# Patient Record
Sex: Female | Born: 1965 | Race: White | Hispanic: No | Smoking: Former smoker
Health system: Southern US, Community
[De-identification: ages and names within clinical notes are randomized; demographics above are authoritative.]

## PROBLEM LIST (undated history)

## (undated) DIAGNOSIS — M1990 Unspecified osteoarthritis, unspecified site: Secondary | ICD-10-CM

## (undated) DIAGNOSIS — F419 Anxiety disorder, unspecified: Secondary | ICD-10-CM

## (undated) DIAGNOSIS — F32A Depression, unspecified: Secondary | ICD-10-CM

## (undated) DIAGNOSIS — D500 Iron deficiency anemia secondary to blood loss (chronic): Secondary | ICD-10-CM

## (undated) DIAGNOSIS — F329 Major depressive disorder, single episode, unspecified: Secondary | ICD-10-CM

## (undated) DIAGNOSIS — R200 Anesthesia of skin: Secondary | ICD-10-CM

## (undated) DIAGNOSIS — Z8619 Personal history of other infectious and parasitic diseases: Secondary | ICD-10-CM

## (undated) DIAGNOSIS — I10 Essential (primary) hypertension: Secondary | ICD-10-CM

## (undated) DIAGNOSIS — C801 Malignant (primary) neoplasm, unspecified: Secondary | ICD-10-CM

## (undated) HISTORY — DX: Iron deficiency anemia secondary to blood loss (chronic): D50.0

## (undated) HISTORY — DX: Major depressive disorder, single episode, unspecified: F32.9

## (undated) HISTORY — DX: Depression, unspecified: F32.A

## (undated) HISTORY — DX: Personal history of other infectious and parasitic diseases: Z86.19

## (undated) HISTORY — DX: Anxiety disorder, unspecified: F41.9

---

## 1996-06-29 HISTORY — PX: BREAST SURGERY: SHX581

## 1997-06-29 HISTORY — PX: TUBAL LIGATION: SHX77

## 1998-11-06 ENCOUNTER — Other Ambulatory Visit: Admission: RE | Admit: 1998-11-06 | Discharge: 1998-11-06 | Payer: Self-pay | Admitting: Obstetrics and Gynecology

## 1999-06-30 HISTORY — PX: APPENDECTOMY: SHX54

## 1999-06-30 HISTORY — PX: ABDOMINAL HYSTERECTOMY: SHX81

## 1999-07-30 ENCOUNTER — Encounter: Payer: Self-pay | Admitting: Obstetrics and Gynecology

## 1999-07-30 ENCOUNTER — Ambulatory Visit (HOSPITAL_COMMUNITY): Admission: RE | Admit: 1999-07-30 | Discharge: 1999-07-30 | Payer: Self-pay | Admitting: Obstetrics and Gynecology

## 1999-08-22 ENCOUNTER — Ambulatory Visit (HOSPITAL_COMMUNITY): Admission: RE | Admit: 1999-08-22 | Discharge: 1999-08-22 | Payer: Self-pay | Admitting: Obstetrics and Gynecology

## 1999-08-23 ENCOUNTER — Observation Stay (HOSPITAL_COMMUNITY): Admission: AD | Admit: 1999-08-23 | Discharge: 1999-08-24 | Payer: Self-pay | Admitting: Obstetrics and Gynecology

## 2004-06-29 DIAGNOSIS — F329 Major depressive disorder, single episode, unspecified: Secondary | ICD-10-CM | POA: Insufficient documentation

## 2004-06-29 DIAGNOSIS — F32A Depression, unspecified: Secondary | ICD-10-CM | POA: Insufficient documentation

## 2004-06-29 DIAGNOSIS — F419 Anxiety disorder, unspecified: Secondary | ICD-10-CM | POA: Insufficient documentation

## 2004-06-29 DIAGNOSIS — Z8619 Personal history of other infectious and parasitic diseases: Secondary | ICD-10-CM | POA: Insufficient documentation

## 2006-06-29 HISTORY — PX: BASAL CELL CARCINOMA EXCISION: SHX1214

## 2007-08-22 DIAGNOSIS — I10 Essential (primary) hypertension: Secondary | ICD-10-CM | POA: Insufficient documentation

## 2007-09-18 DIAGNOSIS — Z8371 Family history of colonic polyps: Secondary | ICD-10-CM | POA: Insufficient documentation

## 2009-02-01 DIAGNOSIS — E781 Pure hyperglyceridemia: Secondary | ICD-10-CM | POA: Insufficient documentation

## 2011-06-30 HISTORY — PX: HIP ARTHROSCOPY: SUR88

## 2012-06-29 HISTORY — PX: MOHS SURGERY: SUR867

## 2013-04-06 LAB — BASIC METABOLIC PANEL
BUN: 8 mg/dL (ref 4–21)
CREATININE: 0.6 mg/dL (ref 0.5–1.1)
Glucose: 100 mg/dL
Sodium: 142 mmol/L (ref 137–147)

## 2013-04-06 LAB — LIPID PANEL
CHOLESTEROL: 186 mg/dL (ref 0–200)
HDL: 65 mg/dL (ref 35–70)
LDL Cholesterol: 87 mg/dL
Triglycerides: 171 mg/dL — AB (ref 40–160)

## 2013-07-28 ENCOUNTER — Ambulatory Visit: Payer: Self-pay | Admitting: Family Medicine

## 2013-08-03 ENCOUNTER — Ambulatory Visit: Payer: Self-pay | Admitting: Family Medicine

## 2013-10-16 NOTE — Progress Notes (Signed)
Need orders in EPIC.  Surgery scheduled for 10/31/13.  Thank You.

## 2013-10-17 ENCOUNTER — Encounter (HOSPITAL_COMMUNITY): Payer: Self-pay | Admitting: Pharmacy Technician

## 2013-10-17 NOTE — Progress Notes (Signed)
Surgery on 10/31/2013.  Preop on 4/28 at 1030am.  Need orders in EPIC  Thank You.

## 2013-10-24 ENCOUNTER — Encounter (HOSPITAL_COMMUNITY): Payer: Self-pay

## 2013-10-24 ENCOUNTER — Encounter (HOSPITAL_COMMUNITY)
Admission: RE | Admit: 2013-10-24 | Discharge: 2013-10-24 | Disposition: A | Discharge status: Home / Self Care | Payer: Medicaid Other | Source: Ambulatory Visit | Attending: Orthopedic Surgery | Admitting: Orthopedic Surgery

## 2013-10-24 ENCOUNTER — Encounter (INDEPENDENT_AMBULATORY_CARE_PROVIDER_SITE_OTHER): Payer: Self-pay

## 2013-10-24 ENCOUNTER — Ambulatory Visit (HOSPITAL_COMMUNITY)
Admission: RE | Admit: 2013-10-24 | Discharge: 2013-10-24 | Disposition: A | Discharge status: Home / Self Care | Payer: Medicaid Other | Source: Ambulatory Visit | Attending: Anesthesiology | Admitting: Anesthesiology

## 2013-10-24 DIAGNOSIS — Z01818 Encounter for other preprocedural examination: Secondary | ICD-10-CM | POA: Insufficient documentation

## 2013-10-24 DIAGNOSIS — Z01812 Encounter for preprocedural laboratory examination: Secondary | ICD-10-CM | POA: Insufficient documentation

## 2013-10-24 HISTORY — DX: Unspecified osteoarthritis, unspecified site: M19.90

## 2013-10-24 HISTORY — DX: Essential (primary) hypertension: I10

## 2013-10-24 HISTORY — DX: Anesthesia of skin: R20.0

## 2013-10-24 HISTORY — DX: Malignant (primary) neoplasm, unspecified: C80.1

## 2013-10-24 LAB — URINALYSIS, ROUTINE W REFLEX MICROSCOPIC
Bilirubin Urine: NEGATIVE
GLUCOSE, UA: NEGATIVE mg/dL
Hgb urine dipstick: NEGATIVE
Ketones, ur: NEGATIVE mg/dL
LEUKOCYTES UA: NEGATIVE
Nitrite: NEGATIVE
PH: 6 (ref 5.0–8.0)
Protein, ur: NEGATIVE mg/dL
Specific Gravity, Urine: 1.005 (ref 1.005–1.030)
Urobilinogen, UA: 0.2 mg/dL (ref 0.0–1.0)

## 2013-10-24 LAB — CBC
HEMATOCRIT: 41 % (ref 36.0–46.0)
Hemoglobin: 13.9 g/dL (ref 12.0–15.0)
MCH: 30.5 pg (ref 26.0–34.0)
MCHC: 33.9 g/dL (ref 30.0–36.0)
MCV: 89.9 fL (ref 78.0–100.0)
Platelets: 322 10*3/uL (ref 150–400)
RBC: 4.56 MIL/uL (ref 3.87–5.11)
RDW: 12.7 % (ref 11.5–15.5)
WBC: 7.5 10*3/uL (ref 4.0–10.5)

## 2013-10-24 LAB — SURGICAL PCR SCREEN
MRSA, PCR: INVALID — AB
Staphylococcus aureus: INVALID — AB

## 2013-10-24 LAB — BASIC METABOLIC PANEL
BUN: 11 mg/dL (ref 6–23)
CHLORIDE: 100 meq/L (ref 96–112)
CO2: 26 meq/L (ref 19–32)
CREATININE: 0.58 mg/dL (ref 0.50–1.10)
Calcium: 9.5 mg/dL (ref 8.4–10.5)
GFR calc Af Amer: >90 mL/min (ref >90)
GFR calc non Af Amer: >90 mL/min (ref >90)
Glucose, Bld: 114 mg/dL — ABNORMAL HIGH (ref 70–99)
Potassium: 4.2 mEq/L (ref 3.7–5.3)
Sodium: 138 mEq/L (ref 137–147)

## 2013-10-24 LAB — PROTIME-INR
INR: 0.96 (ref 0.00–1.49)
Prothrombin Time: 12.6 seconds (ref 11.6–15.2)

## 2013-10-24 LAB — APTT: aPTT: 28 seconds (ref 24–37)

## 2013-10-24 LAB — ABO/RH: ABO/RH(D): A POS

## 2013-10-24 NOTE — Pre-Procedure Instructions (Addendum)
EKG REPORT ON PT'S CHART FROM DR. D. FISHER FROM 04/03/13 AND MEDICAL CLEARANCE ON CHART FROM DR. FISHER. CXR WAS DONE TODAY PREOP AS PER ANESTHESIOLOGIST'S GUIDELINES

## 2013-10-24 NOTE — Patient Instructions (Addendum)
YOUR SURGERY IS SCHEDULED AT [facility]  ON:  Tuesday  5/5  REPORT TO  SHORT STAY CENTER AT:   9:00 AM         PHONE # FOR SHORT STAY IS [phone]  DO NOT EAT OR DRINK ANYTHING AFTER MIDNIGHT THE NIGHT BEFORE YOUR SURGERY.  YOU MAY BRUSH YOUR TEETH, RINSE OUT YOUR MOUTH--BUT NO WATER, NO FOOD, NO CHEWING GUM, NO MINTS, NO CANDIES, NO CHEWING TOBACCO.  PLEASE TAKE THE FOLLOWING MEDICATIONS THE AM OF YOUR SURGERY WITH A FEW SIPS OF WATER:  ALPRAZOLAM ( XANAX ), AMLODIPINE ( NORVASC ).  DO NOT BRING VALUABLES, MONEY, CREDIT CARDS.  DO NOT WEAR JEWELRY, MAKE-UP, NAIL POLISH AND NO METAL PINS OR CLIPS IN YOUR HAIR. CONTACT LENS, DENTURES / PARTIALS, GLASSES SHOULD NOT BE WORN TO SURGERY AND IN MOST CASES-HEARING AIDS WILL NEED TO BE REMOVED.  BRING YOUR GLASSES CASE, ANY EQUIPMENT NEEDED FOR YOUR CONTACT LENS. FOR PATIENTS ADMITTED TO THE HOSPITAL--CHECK OUT TIME THE DAY OF DISCHARGE IS 11:00 AM.  ALL INPATIENT ROOMS ARE PRIVATE - WITH BATHROOM, TELEPHONE, TELEVISION AND WIFI INTERNET.                                                    PLEASE READ OVER ANY  FACT SHEETS THAT YOU WERE GIVEN: MRSA INFORMATION, BLOOD TRANSFUSION INFORMATION, INCENTIVE SPIROMETER INFORMATION.  PLEASE BE AWARE THAT YOU MAY NEED ADDITIONAL BLOOD DRAWN DAY OF YOUR SURGERY  _______________________________________________________________________     Incentive Spirometer  An incentive spirometer is a tool that can help keep your lungs clear and active. This tool measures how well you are filling your lungs with each breath. Taking long deep breaths may help reverse or decrease the chance of developing breathing (pulmonary) problems (especially infection) following:  A long period of time when you are unable to move or be active. BEFORE THE PROCEDURE   If the spirometer includes an indicator to show your best effort, your nurse or respiratory therapist will set it to a desired goal.  If possible,  sit up straight or lean slightly forward. Try not to slouch.  Hold the incentive spirometer in an upright position. INSTRUCTIONS FOR USE  1. Sit on the edge of your bed if possible, or sit up as far as you can in bed or on a chair. 2. Hold the incentive spirometer in an upright position. 3. Breathe out normally. 4. Place the mouthpiece in your mouth and seal your lips tightly around it. 5. Breathe in slowly and as deeply as possible, raising the piston or the [place] toward the top of the column. 6. Hold your breath for 3-5 seconds or for as long as possible. Allow the piston or [place] to fall to the bottom of the column. 7. Remove the mouthpiece from your mouth and breathe out normally. 8. Rest for a few seconds and repeat Steps 1 through 7 at least 10 times every 1-2 hours when you are awake. Take your time and take a few normal breaths between deep breaths. 9. The spirometer may include an indicator to show your best effort. Use the indicator as a goal to work toward during each repetition. 10. After each set of 10 deep breaths, practice coughing to be sure your lungs are clear. If you have an incision (the cut made at  the time of surgery), support your incision when coughing by placing a pillow or rolled up towels firmly against it. Once you are able to get out of bed, walk around indoors and cough well. You may stop using the incentive spirometer when instructed by your caregiver.  RISKS AND COMPLICATIONS  Take your time so you do not get dizzy or light-headed.  If you are in pain, you may need to take or ask for pain medication before doing incentive spirometry. It is harder to take a deep breath if you are having pain. AFTER USE  Rest and breathe slowly and easily.  It can be helpful to keep track of a log of your progress. Your caregiver can provide you with a simple table to help with this. If you are using the spirometer at home, follow these instructions: [place] IF:   You  are having difficultly using the spirometer.  You have trouble using the spirometer as often as instructed.  Your pain medication is not giving enough relief while using the spirometer.  You develop fever of 100.5 F (38.1 C) or higher. SEEK IMMEDIATE MEDICAL CARE IF:   You cough up bloody sputum that had not been present before.  You develop fever of 102 F (38.9 C) or greater.  You develop worsening pain at or near the incision site. MAKE SURE YOU:   Understand these instructions.  Will watch your condition.  Will get help right away if you are not doing well or get worse. Document Released: 10/26/2006 Document Revised: 09/07/2011 Document Reviewed: 12/27/2006 Exit[employer] Patient Information 2014 [place].   ________________________________________________________________________ [facility] - Preparing for Surgery Before surgery, you can play an important role.  Because skin is not sterile, your skin needs to be as free of germs as possible.  You can reduce the number of germs on your skin by washing with CHG (chlorahexidine gluconate) soap before surgery.  CHG is an antiseptic cleaner which kills germs and bonds with the skin to continue killing germs even after washing. Please DO NOT use if you have an allergy to CHG or antibacterial soaps.  If your skin becomes reddened/irritated stop using the CHG and inform your nurse when you arrive at Short Stay. Do not shave (including legs and underarms) for at least 48 hours prior to the first CHG shower.  You may shave your face. Please follow these instructions carefully:  1.  Shower with CHG Soap the night before surgery and the  morning of Surgery.  2.  If you choose to wash your hair, wash your hair first as usual with your  normal  shampoo.  3.  After you shampoo, rinse your hair and body thoroughly to remove the  shampoo.                           4.  Use CHG as you would any other liquid soap.  You can apply chg directly   to the skin and wash                       Gently with a scrungie or clean washcloth.  5.  Apply the CHG Soap to your body ONLY FROM THE NECK DOWN.   Do not use on open                           Wound or open sores.  Avoid contact with eyes, ears mouth and genitals (private parts).                        Genitals (private parts) with your normal soap.             6.  Wash thoroughly, paying special attention to the area where your surgery  will be performed.  7.  Thoroughly rinse your body with warm water from the neck down.  8.  DO NOT shower/wash with your normal soap after using and rinsing off  the CHG Soap.                9.  Pat yourself dry with a clean towel.            10.  Wear clean pajamas.            11.  Place clean sheets on your bed the night of your first shower and do not  sleep with pets. Day of Surgery : Do not apply any lotions/deodorants the morning of surgery.  Please wear clean clothes to the hospital/surgery center.  FAILURE TO FOLLOW THESE INSTRUCTIONS MAY RESULT IN THE CANCELLATION OF YOUR SURGERY PATIENT SIGNATURE_________________________________  NURSE SIGNATURE__________________________________  ________________________________________________________________________  WHAT IS A BLOOD TRANSFUSION? Blood Transfusion Information  A transfusion is the replacement of blood or some of its parts. Blood is made up of multiple cells which provide different functions.  Red blood cells carry oxygen and are used for blood loss replacement.  White blood cells fight against infection.  Platelets control bleeding.  Plasma helps clot blood.  Other blood products are available for specialized needs, such as hemophilia or other clotting disorders. BEFORE THE TRANSFUSION  Who gives blood for transfusions?   Healthy volunteers who are fully evaluated to make sure their blood is safe. This is blood bank blood. Transfusion therapy is the safest it has ever been in the  practice of medicine. Before blood is taken from a donor, a complete history is taken to make sure that person has no history of diseases nor engages in risky social behavior (examples are intravenous drug use or sexual activity with multiple partners). The donor's travel history is screened to minimize risk of transmitting infections, such as malaria. The donated blood is tested for signs of infectious diseases, such as HIV and hepatitis. The blood is then tested to be sure it is compatible with you in order to minimize the chance of a transfusion reaction. If you or a relative donates blood, this is often done in anticipation of surgery and is not appropriate for emergency situations. It takes many days to process the donated blood. RISKS AND COMPLICATIONS Although transfusion therapy is very safe and saves many lives, the main dangers of transfusion include:   Getting an infectious disease.  Developing a transfusion reaction. This is an allergic reaction to something in the blood you were given. Every precaution is taken to prevent this. The decision to have a blood transfusion has been considered carefully by your caregiver before blood is given. Blood is not given unless the benefits outweigh the risks. AFTER THE TRANSFUSION  Right after receiving a blood transfusion, you will usually feel much better and more energetic. This is especially true if your red blood cells have gotten low (anemic). The transfusion raises the level of the red blood cells which carry oxygen, and this usually causes an energy increase.  The nurse administering the transfusion will monitor  you carefully for complications. HOME CARE INSTRUCTIONS  No special instructions are needed after a transfusion. You may find your energy is better. Speak with your caregiver about any limitations on activity for underlying diseases you may have. SEEK MEDICAL CARE IF:   Your condition is not improving after your transfusion.  You  develop redness or irritation at the intravenous (IV) site. SEEK IMMEDIATE MEDICAL CARE IF:  Any of the following symptoms occur over the next 12 hours:  Shaking chills.  You have a temperature by mouth above 102 F (38.9 C), not controlled by medicine.  Chest, back, or muscle pain.  People around you feel you are not acting correctly or are confused.  Shortness of breath or difficulty breathing.  Dizziness and fainting.  You get a rash or develop hives.  You have a decrease in urine output.  Your urine turns a dark color or changes to pink, red, or brown. Any of the following symptoms occur over the next 10 days:  You have a temperature by mouth above 102 F (38.9 C), not controlled by medicine.  Shortness of breath.  Weakness after normal activity.  The white part of the eye turns yellow (jaundice).  You have a decrease in the amount of urine or are urinating less often.  Your urine turns a dark color or changes to pink, red, or brown. Document Released: 06/12/2000 Document Revised: 09/07/2011 Document Reviewed: 01/30/2008 [facility] Patient Information 2014 [place], [place].  _______________________________________________________________________

## 2013-10-26 LAB — MRSA CULTURE

## 2013-10-31 ENCOUNTER — Encounter (HOSPITAL_COMMUNITY): Payer: Self-pay | Admitting: *Deleted

## 2013-10-31 ENCOUNTER — Inpatient Hospital Stay (HOSPITAL_COMMUNITY)
Admission: RE | Admit: 2013-10-31 | Discharge: 2013-11-01 | DRG: 470 | Disposition: A | Discharge status: Home Health | Payer: Medicaid Other | Source: Ambulatory Visit | Attending: Orthopedic Surgery | Admitting: Orthopedic Surgery

## 2013-10-31 ENCOUNTER — Inpatient Hospital Stay (HOSPITAL_COMMUNITY): Payer: Medicaid Other | Admitting: Registered Nurse

## 2013-10-31 ENCOUNTER — Encounter (HOSPITAL_COMMUNITY)
Admission: RE | Disposition: A | Discharge status: Home Health | Payer: Self-pay | Source: Ambulatory Visit | Attending: Orthopedic Surgery

## 2013-10-31 ENCOUNTER — Encounter (HOSPITAL_COMMUNITY): Payer: Medicaid Other | Admitting: Registered Nurse

## 2013-10-31 ENCOUNTER — Inpatient Hospital Stay (HOSPITAL_COMMUNITY): Payer: Medicaid Other

## 2013-10-31 DIAGNOSIS — E663 Overweight: Secondary | ICD-10-CM | POA: Diagnosis present

## 2013-10-31 DIAGNOSIS — I10 Essential (primary) hypertension: Secondary | ICD-10-CM | POA: Diagnosis present

## 2013-10-31 DIAGNOSIS — M1610 Unilateral primary osteoarthritis, unspecified hip: Principal | ICD-10-CM | POA: Diagnosis present

## 2013-10-31 DIAGNOSIS — M169 Osteoarthritis of hip, unspecified: Principal | ICD-10-CM | POA: Diagnosis present

## 2013-10-31 DIAGNOSIS — Z87891 Personal history of nicotine dependence: Secondary | ICD-10-CM

## 2013-10-31 DIAGNOSIS — D62 Acute posthemorrhagic anemia: Secondary | ICD-10-CM | POA: Diagnosis not present

## 2013-10-31 DIAGNOSIS — Z96649 Presence of unspecified artificial hip joint: Secondary | ICD-10-CM

## 2013-10-31 DIAGNOSIS — D500 Iron deficiency anemia secondary to blood loss (chronic): Secondary | ICD-10-CM | POA: Diagnosis not present

## 2013-10-31 DIAGNOSIS — Z6827 Body mass index (BMI) 27.0-27.9, adult: Secondary | ICD-10-CM

## 2013-10-31 HISTORY — PX: TOTAL HIP ARTHROPLASTY: SHX124

## 2013-10-31 LAB — TYPE AND SCREEN
ABO/RH(D): A POS
Antibody Screen: NEGATIVE

## 2013-10-31 SURGERY — ARTHROPLASTY, HIP, TOTAL, ANTERIOR APPROACH
Anesthesia: General | Site: Hip | Laterality: Right

## 2013-10-31 MED ORDER — 0.9 % SODIUM CHLORIDE (POUR BTL) OPTIME
TOPICAL | Status: DC | PRN
  Administered 2013-10-31: 1000 mL

## 2013-10-31 MED ORDER — POLYETHYLENE GLYCOL 3350 17 G PO PACK: 17.0000 g | PACK | Freq: Every day | ORAL | Status: DC | PRN

## 2013-10-31 MED ORDER — PROMETHAZINE HCL 25 MG/ML IJ SOLN: 6.2500 mg | INTRAMUSCULAR | Status: DC | PRN

## 2013-10-31 MED ORDER — ALPRAZOLAM 0.5 MG PO TABS
0.5000 mg | ORAL_TABLET | Freq: Two times a day (BID) | ORAL | Status: DC | PRN
  Filled 2013-10-31: qty 1

## 2013-10-31 MED ORDER — DOCUSATE SODIUM 100 MG PO CAPS
100.0000 mg | ORAL_CAPSULE | Freq: Two times a day (BID) | ORAL | Status: DC
  Administered 2013-10-31 – 2013-11-01 (×3): 100 mg via ORAL

## 2013-10-31 MED ORDER — HYDROMORPHONE HCL PF 1 MG/ML IJ SOLN
0.2500 mg | INTRAMUSCULAR | Status: DC | PRN
  Administered 2013-10-31 (×4): 0.5 mg via INTRAVENOUS

## 2013-10-31 MED ORDER — MIDAZOLAM HCL 2 MG/2ML IJ SOLN
INTRAMUSCULAR | Status: AC
  Filled 2013-10-31: qty 2

## 2013-10-31 MED ORDER — LACTATED RINGERS IV SOLN
INTRAVENOUS | Status: DC
  Administered 2013-10-31: 1000 mL via INTRAVENOUS

## 2013-10-31 MED ORDER — PROMETHAZINE HCL 25 MG/ML IJ SOLN
12.5000 mg | Freq: Four times a day (QID) | INTRAMUSCULAR | Status: DC | PRN
  Administered 2013-10-31: 12.5 mg via INTRAVENOUS
  Filled 2013-10-31: qty 1

## 2013-10-31 MED ORDER — PROPOFOL 10 MG/ML IV BOLUS
INTRAVENOUS | Status: DC | PRN
  Administered 2013-10-31: 200 mg via INTRAVENOUS

## 2013-10-31 MED ORDER — FERROUS SULFATE 325 (65 FE) MG PO TABS
325.0000 mg | ORAL_TABLET | Freq: Three times a day (TID) | ORAL | Status: DC
  Filled 2013-10-31 (×5): qty 1

## 2013-10-31 MED ORDER — FENTANYL CITRATE 0.05 MG/ML IJ SOLN
INTRAMUSCULAR | Status: DC | PRN
  Administered 2013-10-31: 50 ug via INTRAVENOUS
  Administered 2013-10-31: 150 ug via INTRAVENOUS
  Administered 2013-10-31: 50 ug via INTRAVENOUS

## 2013-10-31 MED ORDER — ONDANSETRON HCL 4 MG/2ML IJ SOLN
INTRAMUSCULAR | Status: DC | PRN
  Administered 2013-10-31: 4 mg via INTRAVENOUS

## 2013-10-31 MED ORDER — CEFAZOLIN SODIUM 1-5 GM-% IV SOLN
1.0000 g | Freq: Four times a day (QID) | INTRAVENOUS | Status: AC
  Administered 2013-10-31 (×2): 1 g via INTRAVENOUS
  Filled 2013-10-31 (×2): qty 50

## 2013-10-31 MED ORDER — ESTRADIOL 1 MG PO TABS
1.0000 mg | ORAL_TABLET | Freq: Every day | ORAL | Status: DC
  Filled 2013-10-31 (×2): qty 1

## 2013-10-31 MED ORDER — METHOCARBAMOL 500 MG PO TABS
500.0000 mg | ORAL_TABLET | Freq: Four times a day (QID) | ORAL | Status: DC | PRN
  Administered 2013-10-31 – 2013-11-01 (×2): 500 mg via ORAL
  Filled 2013-10-31 (×2): qty 1

## 2013-10-31 MED ORDER — NEOSTIGMINE METHYLSULFATE 10 MG/10ML IV SOLN
INTRAVENOUS | Status: DC | PRN
  Administered 2013-10-31: 4 mg via INTRAVENOUS

## 2013-10-31 MED ORDER — ROCURONIUM BROMIDE 100 MG/10ML IV SOLN
INTRAVENOUS | Status: DC | PRN
  Administered 2013-10-31: 10 mg via INTRAVENOUS
  Administered 2013-10-31: 40 mg via INTRAVENOUS

## 2013-10-31 MED ORDER — LIDOCAINE HCL (CARDIAC) 20 MG/ML IV SOLN
INTRAVENOUS | Status: AC
  Filled 2013-10-31: qty 5

## 2013-10-31 MED ORDER — HYDROMORPHONE HCL PF 1 MG/ML IJ SOLN
INTRAMUSCULAR | Status: DC | PRN
  Administered 2013-10-31: 1 mg via INTRAVENOUS
  Administered 2013-10-31 (×2): 0.5 mg via INTRAVENOUS

## 2013-10-31 MED ORDER — SODIUM CHLORIDE 0.9 % IJ SOLN
INTRAMUSCULAR | Status: AC
  Filled 2013-10-31: qty 10

## 2013-10-31 MED ORDER — CEFAZOLIN SODIUM-DEXTROSE 2-3 GM-% IV SOLR
INTRAVENOUS | Status: AC
  Filled 2013-10-31: qty 50

## 2013-10-31 MED ORDER — HYDROMORPHONE HCL PF 1 MG/ML IJ SOLN
0.5000 mg | INTRAMUSCULAR | Status: DC | PRN
  Administered 2013-10-31: 1 mg via INTRAVENOUS
  Filled 2013-10-31: qty 1

## 2013-10-31 MED ORDER — HYDROMORPHONE HCL PF 1 MG/ML IJ SOLN
INTRAMUSCULAR | Status: AC
  Filled 2013-10-31: qty 1

## 2013-10-31 MED ORDER — METOCLOPRAMIDE HCL 5 MG/ML IJ SOLN
5.0000 mg | Freq: Four times a day (QID) | INTRAMUSCULAR | Status: DC | PRN
  Administered 2013-10-31: 10 mg via INTRAVENOUS
  Filled 2013-10-31: qty 2

## 2013-10-31 MED ORDER — EPHEDRINE SULFATE 50 MG/ML IJ SOLN
INTRAMUSCULAR | Status: AC
  Filled 2013-10-31: qty 1

## 2013-10-31 MED ORDER — MENTHOL 3 MG MT LOZG: 1.0000 | LOZENGE | OROMUCOSAL | Status: DC | PRN

## 2013-10-31 MED ORDER — STERILE WATER FOR IRRIGATION IR SOLN
Status: DC | PRN
  Administered 2013-10-31: 1500 mL

## 2013-10-31 MED ORDER — GLYCOPYRROLATE 0.2 MG/ML IJ SOLN
INTRAMUSCULAR | Status: DC | PRN
  Administered 2013-10-31: .6 mg via INTRAVENOUS

## 2013-10-31 MED ORDER — METHOCARBAMOL 1000 MG/10ML IJ SOLN
500.0000 mg | Freq: Four times a day (QID) | INTRAVENOUS | Status: DC | PRN
  Administered 2013-10-31: 500 mg via INTRAVENOUS
  Filled 2013-10-31: qty 5

## 2013-10-31 MED ORDER — SODIUM CHLORIDE 0.9 % IV SOLN
INTRAVENOUS | Status: DC
  Administered 2013-10-31: 17:00:00 via INTRAVENOUS
  Filled 2013-10-31 (×9): qty 1000

## 2013-10-31 MED ORDER — SENNA 8.6 MG PO TABS
1.0000 | ORAL_TABLET | Freq: Two times a day (BID) | ORAL | Status: DC
  Administered 2013-10-31 – 2013-11-01 (×3): 8.6 mg via ORAL

## 2013-10-31 MED ORDER — DEXAMETHASONE SODIUM PHOSPHATE 10 MG/ML IJ SOLN
INTRAMUSCULAR | Status: DC | PRN
  Administered 2013-10-31: 10 mg via INTRAVENOUS

## 2013-10-31 MED ORDER — PHENOL 1.4 % MT LIQD: 1.0000 | OROMUCOSAL | Status: DC | PRN

## 2013-10-31 MED ORDER — AMLODIPINE BESYLATE 5 MG PO TABS
5.0000 mg | ORAL_TABLET | Freq: Every morning | ORAL | Status: DC
  Administered 2013-11-01: 5 mg via ORAL
  Filled 2013-10-31: qty 1

## 2013-10-31 MED ORDER — DEXAMETHASONE SODIUM PHOSPHATE 10 MG/ML IJ SOLN
10.0000 mg | Freq: Once | INTRAMUSCULAR | Status: DC
  Filled 2013-10-31: qty 1

## 2013-10-31 MED ORDER — ASPIRIN EC 325 MG PO TBEC
325.0000 mg | DELAYED_RELEASE_TABLET | Freq: Two times a day (BID) | ORAL | Status: DC
  Administered 2013-11-01: 325 mg via ORAL
  Filled 2013-10-31 (×3): qty 1

## 2013-10-31 MED ORDER — LIDOCAINE HCL (CARDIAC) 20 MG/ML IV SOLN
INTRAVENOUS | Status: DC | PRN
  Administered 2013-10-31: 100 mg via INTRAVENOUS

## 2013-10-31 MED ORDER — CHLORHEXIDINE GLUCONATE 4 % EX LIQD: 60.0000 mL | Freq: Once | CUTANEOUS | Status: DC

## 2013-10-31 MED ORDER — ONDANSETRON HCL 4 MG/2ML IJ SOLN: 4.0000 mg | Freq: Four times a day (QID) | INTRAMUSCULAR | Status: DC | PRN

## 2013-10-31 MED ORDER — HYDROCODONE-ACETAMINOPHEN 7.5-325 MG PO TABS
1.0000 | ORAL_TABLET | ORAL | Status: DC
  Administered 2013-10-31 – 2013-11-01 (×6): 2 via ORAL
  Filled 2013-10-31 (×6): qty 2

## 2013-10-31 MED ORDER — FENTANYL CITRATE 0.05 MG/ML IJ SOLN
INTRAMUSCULAR | Status: AC
  Filled 2013-10-31: qty 5

## 2013-10-31 MED ORDER — DEXAMETHASONE SODIUM PHOSPHATE 10 MG/ML IJ SOLN
INTRAMUSCULAR | Status: AC
  Filled 2013-10-31: qty 1

## 2013-10-31 MED ORDER — HYDROMORPHONE HCL PF 2 MG/ML IJ SOLN
INTRAMUSCULAR | Status: AC
  Filled 2013-10-31: qty 1

## 2013-10-31 MED ORDER — ONDANSETRON HCL 4 MG/2ML IJ SOLN
INTRAMUSCULAR | Status: AC
  Filled 2013-10-31: qty 2

## 2013-10-31 MED ORDER — ALUM & MAG HYDROXIDE-SIMETH 200-200-20 MG/5ML PO SUSP: 30.0000 mL | ORAL | Status: DC | PRN

## 2013-10-31 MED ORDER — ATROPINE SULFATE 0.4 MG/ML IJ SOLN
INTRAMUSCULAR | Status: AC
  Filled 2013-10-31: qty 1

## 2013-10-31 MED ORDER — CEFAZOLIN SODIUM-DEXTROSE 2-3 GM-% IV SOLR
2.0000 g | INTRAVENOUS | Status: AC
  Administered 2013-10-31: 2 g via INTRAVENOUS

## 2013-10-31 MED ORDER — PROPOFOL 10 MG/ML IV BOLUS
INTRAVENOUS | Status: AC
  Filled 2013-10-31: qty 20

## 2013-10-31 MED ORDER — GLYCOPYRROLATE 0.2 MG/ML IJ SOLN
INTRAMUSCULAR | Status: AC
  Filled 2013-10-31: qty 3

## 2013-10-31 MED ORDER — DIPHENHYDRAMINE HCL 12.5 MG/5ML PO ELIX: 25.0000 mg | ORAL_SOLUTION | Freq: Four times a day (QID) | ORAL | Status: DC | PRN

## 2013-10-31 MED ORDER — EPHEDRINE SULFATE 50 MG/ML IJ SOLN
INTRAMUSCULAR | Status: DC | PRN
  Administered 2013-10-31: 5 mg via INTRAVENOUS

## 2013-10-31 MED ORDER — MIDAZOLAM HCL 5 MG/5ML IJ SOLN
INTRAMUSCULAR | Status: DC | PRN
  Administered 2013-10-31: 2 mg via INTRAVENOUS

## 2013-10-31 MED ORDER — ONDANSETRON HCL 4 MG PO TABS: 4.0000 mg | ORAL_TABLET | Freq: Four times a day (QID) | ORAL | Status: DC | PRN

## 2013-10-31 SURGICAL SUPPLY — 38 items
ADH SKN CLS APL DERMABOND .7 (GAUZE/BANDAGES/DRESSINGS) ×1
BAG SPEC THK2 15X12 ZIP CLS (MISCELLANEOUS)
BAG ZIPLOCK 12X15 (MISCELLANEOUS) IMPLANT
CAPT HIP CERAMAX ×1 IMPLANT
COVER PERINEAL POST (MISCELLANEOUS) ×2 IMPLANT
DERMABOND ADVANCED (GAUZE/BANDAGES/DRESSINGS) ×1
DERMABOND ADVANCED .7 DNX12 (GAUZE/BANDAGES/DRESSINGS) ×1 IMPLANT
DRAPE C-ARM 42X120 X-RAY (DRAPES) ×2 IMPLANT
DRAPE STERI IOBAN 125X83 (DRAPES) ×2 IMPLANT
DRAPE U-SHAPE 47X51 STRL (DRAPES) ×6 IMPLANT
DRSG AQUACEL AG ADV 3.5X10 (GAUZE/BANDAGES/DRESSINGS) ×2 IMPLANT
DURAPREP 26ML APPLICATOR (WOUND CARE) ×2 IMPLANT
ELECT BLADE TIP CTD 4 INCH (ELECTRODE) ×2 IMPLANT
ELECT PENCIL ROCKER SW 15FT (MISCELLANEOUS) ×2 IMPLANT
ELECT REM PT RETURN 15FT ADLT (MISCELLANEOUS) ×2 IMPLANT
FACESHIELD WRAPAROUND (MASK) ×8 IMPLANT
FACESHIELD WRAPAROUND OR TEAM (MASK) ×4 IMPLANT
GLOVE BIOGEL PI IND STRL 7.5 (GLOVE) ×1 IMPLANT
GLOVE BIOGEL PI IND STRL 8 (GLOVE) ×1 IMPLANT
GLOVE BIOGEL PI INDICATOR 7.5 (GLOVE) ×1
GLOVE BIOGEL PI INDICATOR 8 (GLOVE) ×1
GLOVE ECLIPSE 8.0 STRL XLNG CF (GLOVE) ×2 IMPLANT
GLOVE ORTHO TXT STRL SZ7.5 (GLOVE) ×4 IMPLANT
GOWN SPEC L3 XXLG W/TWL (GOWN DISPOSABLE) ×2 IMPLANT
GOWN STRL REUS W/TWL LRG LVL3 (GOWN DISPOSABLE) ×2 IMPLANT
KIT BASIN OR (CUSTOM PROCEDURE TRAY) ×2 IMPLANT
LINER BOOT UNIVERSAL DISP (MISCELLANEOUS) ×2 IMPLANT
PACK TOTAL JOINT (CUSTOM PROCEDURE TRAY) ×2 IMPLANT
PADDING CAST COTTON 6X4 STRL (CAST SUPPLIES) ×2 IMPLANT
SAW OSC TIP CART 19.5X105X1.3 (SAW) ×2 IMPLANT
SUT MNCRL AB 4-0 PS2 18 (SUTURE) ×2 IMPLANT
SUT VIC AB 1 CT1 36 (SUTURE) ×6 IMPLANT
SUT VIC AB 2-0 CT1 27 (SUTURE) ×4
SUT VIC AB 2-0 CT1 TAPERPNT 27 (SUTURE) ×2 IMPLANT
SUT VLOC 180 0 24IN GS25 (SUTURE) ×2 IMPLANT
TOWEL OR 17X26 10 PK STRL BLUE (TOWEL DISPOSABLE) ×2 IMPLANT
TOWEL OR NON WOVEN STRL DISP B (DISPOSABLE) IMPLANT
TRAY FOLEY CATH 14FRSI W/METER (CATHETERS) ×1 IMPLANT

## 2013-10-31 NOTE — Transfer of Care (Signed)
Immediate Anesthesia Transfer of Care Note  Patient: Olivia Meyer  Procedure(s) Performed: Procedure(s): RIGHT TOTAL HIP ARTHROPLASTY ANTERIOR APPROACH (Right)  Patient Location: PACU  Anesthesia Type:General  Level of Consciousness: awake, alert  and oriented  Airway & Oxygen Therapy: Patient Spontanous Breathing and Patient connected to face mask oxygen  Post-op Assessment: Report given to PACU RN and Post -op Vital signs reviewed and stable  Post vital signs: Reviewed and stable  Complications: No apparent anesthesia complications

## 2013-10-31 NOTE — Interval H&P Note (Signed)
History and Physical Interval Note:  10/31/2013 11:44 AM  Olivia Meyer  has presented today for surgery, with the diagnosis of RIGHT HIP OA  The various methods of treatment have been discussed with the patient and family. After consideration of risks, benefits and other options for treatment, the patient has consented to  Procedure(s): RIGHT TOTAL HIP ARTHROPLASTY ANTERIOR APPROACH (Right) as a surgical intervention .  The patient's history has been reviewed, patient examined, no change in status, stable for surgery.  I have reviewed the patient's chart and labs.  Questions were answered to the patient's satisfaction.     Mauri Pole

## 2013-10-31 NOTE — Op Note (Signed)
NAME:  Olivia Meyer                ACCOUNT NO.: [id number]      MEDICAL RECORD NO.: [id number]      FACILITY:  [facility]      PHYSICIAN:  Mauri Pole  DATE OF BIRTH:  1966/06/15     DATE OF PROCEDURE:  10/31/2013                                 OPERATIVE REPORT         PREOPERATIVE DIAGNOSIS: Right  hip osteoarthritis.      POSTOPERATIVE DIAGNOSIS:  Right hip osteoarthritis.      PROCEDURE:  Right total hip replacement through an anterior approach   utilizing DePuy THR system, component size 62mm pinnacle cup, a size 36 neutral   Delta ceramic liner, a size 2 Standard Tri Lock stem with a 36+1.5 delta ceramic   Lumm.      SURGEON:  Pietro Cassis. Alvan Dame, M.D.      ASSISTANT:  Molli Barrows, PA-C      ANESTHESIA:  General.      SPECIMENS:  None.      COMPLICATIONS:  None.      BLOOD LOSS:  325 cc     DRAINS:  None.      INDICATION OF THE PROCEDURE:  Olivia Meyer is a 48 y.o. female who had   presented to office for evaluation of right hip pain.  She has a history of having a right hip arthroscopy 2 years ago with progressive pain and dysfunction.  Radiographs revealed progressive degenerative changes with bone-on-bone   articulation to the  hip joint.  The patient had painful limited range of   motion significantly affecting their overall quality of life.  The patient was failing to    respond to conservative measures, and at this point was ready   to proceed with more definitive measures.  The patient has noted progressive   degenerative changes in his hip, progressive problems and dysfunction   with regarding the hip prior to surgery.  Consent was obtained for   benefit of pain relief.  Specific risk of infection, DVT, component   failure, dislocation, need for revision surgery, as well discussion of   the anterior versus posterior approach were reviewed.  Consent was   obtained for benefit of anterior pain relief through an anterior   approach.       PROCEDURE IN DETAIL:  The patient was brought to operative theater.   Once adequate anesthesia, preoperative antibiotics, 2gm of Ancef administered.   The patient was positioned supine on the OSI Hanna table.  Once adequate   padding of boney process was carried out, we had predraped out the hip, and  used fluoroscopy to confirm orientation of the pelvis and position.      The right hip was then prepped and draped from proximal iliac crest to   mid thigh with shower curtain technique.      Time-out was performed identifying the patient, planned procedure, and   extremity.     An incision was then made 2 cm distal and lateral to the   anterior superior iliac spine extending over the orientation of the   tensor fascia lata muscle and sharp dissection was carried down to the   fascia of the muscle and protractor placed in the  soft tissues.      The fascia was then incised.  The muscle belly was identified and swept   laterally and retractor placed along the superior neck.  Following   cauterization of the circumflex vessels and removing some pericapsular   fat, a second cobra retractor was placed on the inferior neck.  A third   retractor was placed on the anterior acetabulum after elevating the   anterior rectus.  A L-capsulotomy was along the line of the   superior neck to the trochanteric fossa, then extended proximally and   distally.  Tag sutures were placed and the retractors were then placed   intracapsular.  We then identified the trochanteric fossa and   orientation of my neck cut, confirmed this radiographically   and then made a neck osteotomy with the femur on traction.  The femoral   head was removed without difficulty or complication.  Traction was let   off and retractors were placed posterior and anterior around the   acetabulum.      The labrum and foveal tissue were debrided.  I began reaming with a 73mm   reamer and reamed up to 95mm reamer with good bony bed  preparation and a 52   cup was chosen to get a 36 mm ceramic liner.  The final 26mm Pinnacle cup was then impacted under fluoroscopy  to confirm the depth of penetration and orientation with respect to   abduction.  A screw was placed followed by the hole eliminator.  The final   79mm Delta ceramic liner was impacted with good visualized rim fit.  The cup was positioned anatomically within the acetabular portion of the pelvis.      At this point, the femur was rolled at 80 degrees.  Further capsule was   released off the inferior aspect of the femoral neck.  I then   released the superior capsule proximally.  The hook was placed laterally   along the femur and elevated manually and held in position with the bed   hook.  The leg was then extended and adducted with the leg rolled to 100   degrees of external rotation.  Once the proximal femur was fully   exposed, I used a box osteotome to set orientation.  I then began   broaching with the starting chili pepper broach and passed this by hand and then broached up to 2.  With the 2 broach in place I chose a standard neck and did a trial reduction.  The offset was appropriate, leg lengths   appeared to be equal, confirmed radiographically.   Given these findings, I went ahead and dislocated the hip, repositioned all   retractors and positioned the right hip in the extended and abducted position.  The final 2 standard offset Tri Lock stem was   chosen and it was impacted down to the level of neck cut.  Based on this   and the trial reduction, a 36+1.5 delta ceramic Hargan was chosen and   impacted onto a clean and dry trunnion, and the hip was reduced.  The   hip had been irrigated throughout the case again at this point.  I did   reapproximate the superior capsular leaflet to the anterior leaflet   using #1 Vicryl.  The fascia of the   tensor fascia lata muscle was then reapproximated using #1 Vicryl and #0 V-lock sutures.  The   remaining wound was  closed with 2-0 Vicryl and running  4-0 Monocryl.   The hip was cleaned, dried, and dressed sterilely using Dermabond and   Aquacel dressing.  She was then brought   to recovery room in stable condition tolerating the procedure well.    Molli Barrows, PA-C was present for the entirety of the case involved from   preoperative positioning, perioperative retractor management, general   facilitation of the case, as well as primary wound closure as assistant.            Pietro Cassis Alvan Dame, M.D.        10/31/2013 1:26 PM

## 2013-10-31 NOTE — Anesthesia Preprocedure Evaluation (Addendum)
Anesthesia Evaluation  Patient identified by MRN, date of birth, ID band Patient awake    Reviewed: Allergy & Precautions, H&P , NPO status , Patient's Chart, lab work & pertinent test results  Airway Mallampati: II TM Distance: >3 FB Neck ROM: Full    Dental no notable dental hx.    Pulmonary neg pulmonary ROS, former smoker,  breath sounds clear to auscultation  Pulmonary exam normal       Cardiovascular hypertension, Pt. on medications Rhythm:Regular Rate:Normal     Neuro/Psych negative neurological ROS  negative psych ROS   GI/Hepatic negative GI ROS, Neg liver ROS,   Endo/Other  negative endocrine ROS  Renal/GU negative Renal ROS  negative genitourinary   Musculoskeletal negative musculoskeletal ROS (+)   Abdominal   Peds negative pediatric ROS (+)  Hematology negative hematology ROS (+)   Anesthesia Other Findings   Reproductive/Obstetrics negative OB ROS                          Anesthesia Physical Anesthesia Plan  ASA: II  Anesthesia Plan: General   Post-op Pain Management:    Induction: Intravenous  Airway Management Planned: Oral ETT  Additional Equipment:   Intra-op Plan:   Post-operative Plan: Extubation in OR  Informed Consent: I have reviewed the patients History and Physical, chart, labs and discussed the procedure including the risks, benefits and alternatives for the proposed anesthesia with the patient or authorized representative who has indicated his/her understanding and acceptance.   Dental advisory given  Plan Discussed with: CRNA  Anesthesia Plan Comments:         Anesthesia Quick Evaluation

## 2013-10-31 NOTE — H&P (Signed)
CC: OA Right Hip HPI: 48 yo female with Right hip pain for years. Pt. Has had prevous treatment by another physician with hip arthroscopy and intraarticular injection with only short term relief. Pt. Saw another physician and was advised she needed THA. Pt seen in our office and felt to need THA right hip. Surgery, risks, benefits and aftercare discussed with pt. PMH: Allergies: ACE inhibiters and Morphine Meds: Tramadol. Amlodipine. Estroven. Alprazolam. Illness: Hypertension. Anxiety. Cancer (Gynecologic) Surgery: Appendectomy. C section. Partial Hysterectomy. Hip arthroscopy. Mohs surgery Lip. Breast Augmentation. Social Hx: Smoke, Neg. Alcohol, wine 5-7 times a week. Unemployed. Lives alone. Family Hx: Cancer, CVA, CAD, Hypertension, OA. ROS: CNS- + anxiety. Pul- neg. CV- Occaisional leg cramps. GI- Neg. GU- neg. PE: BP 143/93 Pulse 71 Resp 18 HEENT: Normocephalic, PERRL, Neck supple without adenopathy, carotids 2+ without bruit. Chest: Clear to auscultation.  Heart: RRR without mm Abdomen: soft, active BS. No masses or organomegaly. Extremities: Upper WNL. Right hip with decreased ROM with pain. N/V intact with occasional tingling. Previous w/u with doppler(nl) and back x rays. Assesment: OA Right Hip Plan: Total Hip arthroplasty by anterior approach.

## 2013-10-31 NOTE — Anesthesia Postprocedure Evaluation (Signed)
  Anesthesia Post-op Note  Patient: Olivia Meyer  Procedure(s) Performed: Procedure(s) (LRB): RIGHT TOTAL HIP ARTHROPLASTY ANTERIOR APPROACH (Right)  Patient Location: PACU  Anesthesia Type: General  Level of Consciousness: awake and alert   Airway and Oxygen Therapy: Patient Spontanous Breathing  Post-op Pain: mild  Post-op Assessment: Post-op Vital signs reviewed, Patient's Cardiovascular Status Stable, Respiratory Function Stable, Patent Airway and No signs of Nausea or vomiting  Last Vitals:  Filed Vitals:   10/31/13 1458  BP: 130/90  Pulse: 78  Temp: 36.3 C  Resp: 12    Post-op Vital Signs: stable   Complications: No apparent anesthesia complications

## 2013-11-01 DIAGNOSIS — E663 Overweight: Secondary | ICD-10-CM | POA: Diagnosis present

## 2013-11-01 DIAGNOSIS — D500 Iron deficiency anemia secondary to blood loss (chronic): Secondary | ICD-10-CM

## 2013-11-01 HISTORY — DX: Iron deficiency anemia secondary to blood loss (chronic): D50.0

## 2013-11-01 LAB — CBC
HCT: 33.9 % — ABNORMAL LOW (ref 36.0–46.0)
Hemoglobin: 11.4 g/dL — ABNORMAL LOW (ref 12.0–15.0)
MCH: 30.4 pg (ref 26.0–34.0)
MCHC: 33.6 g/dL (ref 30.0–36.0)
MCV: 90.4 fL (ref 78.0–100.0)
Platelets: 308 10*3/uL (ref 150–400)
RBC: 3.75 MIL/uL — ABNORMAL LOW (ref 3.87–5.11)
RDW: 12.7 % (ref 11.5–15.5)
WBC: 11.6 10*3/uL — ABNORMAL HIGH (ref 4.0–10.5)

## 2013-11-01 LAB — BASIC METABOLIC PANEL
BUN: 9 mg/dL (ref 6–23)
CALCIUM: 9 mg/dL (ref 8.4–10.5)
CO2: 24 meq/L (ref 19–32)
Chloride: 103 mEq/L (ref 96–112)
Creatinine, Ser: 0.57 mg/dL (ref 0.50–1.10)
GFR calc Af Amer: >90 mL/min (ref >90)
GFR calc non Af Amer: >90 mL/min (ref >90)
Glucose, Bld: 145 mg/dL — ABNORMAL HIGH (ref 70–99)
Potassium: 4.5 mEq/L (ref 3.7–5.3)
SODIUM: 138 meq/L (ref 137–147)

## 2013-11-01 MED ORDER — METHOCARBAMOL 500 MG PO TABS: 500.0000 mg | ORAL_TABLET | Freq: Four times a day (QID) | ORAL | Status: DC | PRN

## 2013-11-01 MED ORDER — DSS 100 MG PO CAPS: 100.0000 mg | ORAL_CAPSULE | Freq: Two times a day (BID) | ORAL | Status: DC

## 2013-11-01 MED ORDER — ASPIRIN 325 MG PO TBEC: 325.0000 mg | DELAYED_RELEASE_TABLET | Freq: Two times a day (BID) | ORAL | Status: AC

## 2013-11-01 MED ORDER — POLYETHYLENE GLYCOL 3350 17 G PO PACK: 17.0000 g | PACK | Freq: Every day | ORAL | Status: DC | PRN

## 2013-11-01 MED ORDER — FERROUS SULFATE 325 (65 FE) MG PO TABS: 325.0000 mg | ORAL_TABLET | Freq: Three times a day (TID) | ORAL | Status: DC

## 2013-11-01 MED ORDER — HYDROCODONE-ACETAMINOPHEN 7.5-325 MG PO TABS: 1.0000 | ORAL_TABLET | ORAL | Status: DC

## 2013-11-01 NOTE — Progress Notes (Signed)
   Subjective: 1 Day Post-Op Procedure(s) (LRB): RIGHT TOTAL HIP ARTHROPLASTY ANTERIOR APPROACH (Right)   Patient reports pain as mild, pain controlled. No events throughout the night.  Ready to be discharged home.  Objective:   VITALS:   Filed Vitals:   11/01/13  BP: 153/96  Pulse: 73  Temp: 97.9 F (36.6 C)   Resp: 16    Neurovascular intact Dorsiflexion/Plantar flexion intact Incision: dressing C/D/I No cellulitis present Compartment soft  LABS  Recent Labs  11/01/13 0533  HGB 11.4*  HCT 33.9*  WBC 11.6*  PLT 308     Recent Labs  11/01/13 0533  NA 138  K 4.5  BUN 9  CREATININE 0.57  GLUCOSE 145*     Assessment/Plan: 1 Day Post-Op Procedure(s) (LRB): RIGHT TOTAL HIP ARTHROPLASTY ANTERIOR APPROACH (Right) Foley cath d/c'ed Advance diet Up with therapy D/C IV fluids Discharge home with home health Follow up in 2 weeks at [facility]. Follow up with OLIN,Lorell Thibodaux D in 2 weeks.  Contact information:  [facility] [address] [place] [phone]    Expected ABLA  Treated with iron and will observe  Overweight (BMI 25-29.9) Estimated body mass index is 27.64 kg/(m^2) as calculated from the following:   Height as of this encounter: 5\' 3"  (1.6 m).   Weight as of this encounter: 70.761 kg (156 lb). Patient also counseled that weight may inhibit the healing process Patient counseled that losing weight will help with future health issues       West Pugh. Khadeejah Castner   PAC  11/01/2013, 10:18 AM

## 2013-11-01 NOTE — Progress Notes (Signed)
Utilization review completed.  

## 2013-11-01 NOTE — Evaluation (Signed)
Occupational Therapy Evaluation Patient Details Name: Olivia Meyer MRN: [id number] DOB: [birth year] Today's Date: 11/01/2013    History of Present Illness  THA - anterior   Clinical Impression   Pt presents to OT with decreased I with ADL activity s/p THA. All education completed regarding ADL activity and THA    Follow Up Recommendations  No OT follow up          Precautions / Restrictions Precautions Precautions: Fall Restrictions Weight Bearing Restrictions: No      Mobility Bed Mobility Overal bed mobility: Needs Assistance Bed Mobility: Supine to Sit     Supine to sit: Supervision     General bed mobility comments: min cues for use of L LE to self assist  Transfers Overall transfer level: Needs assistance Equipment used: Rolling walker (2 wheeled) Transfers: Sit to/from Stand Sit to Stand: Supervision         General transfer comment: cues for transition position and use of UEs to self assist         ADL Overall ADL's : Needs assistance/impaired Eating/Feeding: Independent       Upper Body Bathing: Set up;Sitting   Lower Body Bathing: Minimal assistance;Sit to/from stand   Upper Body Dressing : Set up;Sitting   Lower Body Dressing: Minimal assistance;Cueing for sequencing   Toilet Transfer: Min guard           Functional mobility during ADLs: Minimal assistance General ADL Comments: Discussed safety with walker for ADL activity , including using a walker bag and not carrying items and walking with the walker     Vision                         Hand Dominance Right   Extremity/Trunk Assessment Upper Extremity Assessment Upper Extremity Assessment: Overall WFL for tasks assessed   Lower Extremity Assessment Lower Extremity Assessment: RLE deficits/detail RLE Deficits / Details: Hip strength 3-/5 with AAROM at hip to 90 flex and 20 abd   Cervical / Trunk Assessment Cervical / Trunk Assessment: Normal   Communication  Communication Communication: No difficulties   Cognition Arousal/Alertness: Awake/alert Behavior During Therapy: WFL for tasks assessed/performed Overall Cognitive Status: Within Functional Limits for tasks assessed                     General Comments       Exercises Exercises: Total Joint     Shoulder Instructions      Home Living Family/patient expects to be discharged to:: Private residence Living Arrangements: Children Available Help at Discharge: Family Type of Home: House Home Access: Stairs to enter Technical brewer of Steps: 1   Home Layout: One level               Home Equipment: None          Prior Functioning/Environment Level of Independence: Independent             OT Diagnosis: Generalized weakness   OT Problem List: Decreased strength;Decreased activity tolerance   OT Treatment/Interventions:      OT Goals(Current goals can be found in the care plan section) Acute Rehab OT Goals Patient Stated Goal: Be able to get back to work  OT Frequency:     Barriers to D/C:            Co-evaluation              End of Session Equipment Utilized During Treatment: Rolling  walker Nurse Communication: Mobility status  Activity Tolerance: Patient tolerated treatment well Patient left: in chair;with family/visitor present;with call bell/phone within reach   Time: 1148-1202 OT Time Calculation (min): 14 min Charges:  OT General Charges $OT Visit: 1 Procedure OT Evaluation $Initial OT Evaluation Tier I: 1 Procedure OT Treatments $Self Care/Home Management : 8-22 mins G-Codes:    Betsy Pries 28-Nov-2013, 12:35 PM

## 2013-11-01 NOTE — Progress Notes (Signed)
Physical Therapy Evaluation Patient Details Name: Olivia Meyer MRN: [id number] DOB: [birth year] Today's Date: 11/01/2013   History of Present Illness     Clinical Impression  Pt s/p R THR presents with decreased R LE strength/ROM and post op pain limiting functional mobility.  Pt should progress well to d/c home with family assist and HHPT follow up.     Follow Up Recommendations Home health PT    Equipment Recommendations  Rolling walker with 5" wheels    Recommendations for Other Services OT consult     Precautions / Restrictions Precautions Precautions: Fall Restrictions Weight Bearing Restrictions: No      Mobility  Bed Mobility Overal bed mobility: Needs Assistance Bed Mobility: Supine to Sit     Supine to sit: Supervision     General bed mobility comments: min cues for use of L LE to self assist  Transfers Overall transfer level: Needs assistance Equipment used: Rolling walker (2 wheeled) Transfers: Sit to/from Stand Sit to Stand: Supervision         General transfer comment: cues for transition position and use of UEs to self assist  Ambulation/Gait Ambulation/Gait assistance: Min guard;Supervision Ambulation Distance (Feet): 150 Feet Assistive device: Rolling walker (2 wheeled) Gait Pattern/deviations: Step-to pattern;Step-through pattern;Decreased step length - right;Decreased step length - left;Shuffle;Trunk flexed     General Gait Details: cues for initial sequence, posture and position from RW  Stairs Stairs: Yes Stairs assistance: Min assist Stair Management: No rails;Step to pattern;Forwards;Backwards Number of Stairs: 1 General stair comments: up/down single step fwd and bkwd  Wheelchair Mobility    Modified Rankin (Stroke Patients Only)       Balance                                             Pertinent Vitals/Pain 5/10; premed, ice pack provided    Home Living Family/patient expects to be discharged  to:: Private residence Living Arrangements: Children Available Help at Discharge: Family Type of Home: House Home Access: Stairs to enter   Technical brewer of Steps: 1 Home Layout: One level Home Equipment: None      Prior Function Level of Independence: Independent               Hand Dominance   Dominant Hand: Right    Extremity/Trunk Assessment   Upper Extremity Assessment: Overall WFL for tasks assessed           Lower Extremity Assessment: RLE deficits/detail RLE Deficits / Details: Hip strength 3-/5 with AAROM at hip to 90 flex and 20 abd    Cervical / Trunk Assessment: Normal  Communication   Communication: No difficulties  Cognition Arousal/Alertness: Awake/alert Behavior During Therapy: WFL for tasks assessed/performed Overall Cognitive Status: Within Functional Limits for tasks assessed                      General Comments      Exercises Total Joint Exercises Ankle Circles/Pumps: AROM;Both;15 reps;Supine Quad Sets: AROM;Both;10 reps;Supine Heel Slides: AAROM;20 reps;Right;Supine Hip ABduction/ADduction: AAROM;Right;10 reps;Supine      Assessment/Plan    PT Assessment Patient needs continued PT services  PT Diagnosis Difficulty walking   PT Problem List Decreased strength;Decreased range of motion;Decreased activity tolerance;Decreased mobility;Decreased knowledge of use of DME;Pain  PT Treatment Interventions DME instruction;Gait training;Stair training;Functional mobility training;Therapeutic activities;Therapeutic exercise;Patient/family education   PT Goals (  Current goals can be found in the Care Plan section) Acute Rehab PT Goals Patient Stated Goal: Be able to get back to work PT Goal Formulation: With patient Time For Goal Achievement: 11/04/13 Potential to Achieve Goals: Good    Frequency 7X/week   Barriers to discharge        Co-evaluation               End of Session Equipment Utilized During  Treatment: Gait belt Activity Tolerance: Patient tolerated treatment well Patient left: in chair;with call bell/phone within reach;with family/visitor present Nurse Communication: Mobility status         Time: 0925-0952 PT Time Calculation (min): 27 min   Charges:   PT Evaluation $Initial PT Evaluation Tier I: 1 Procedure PT Treatments $Gait Training: 8-22 mins $Therapeutic Exercise: 8-22 mins   PT G Codes:          Mathis Fare 11/01/2013, 12:34 PM

## 2013-11-01 NOTE — Care Management Note (Signed)
    Page 1 of 2   11/01/2013     9:10:59 AM CARE MANAGEMENT NOTE 11/01/2013  Patient:  Olivia Meyer, Olivia Meyer   Account Number:  [id number]  Date Initiated:  11/01/2013  Documentation initiated by:  [facility]  Subjective/Objective Assessment:   adm: OA right hip/RIGHT TOTAL HIP ARTHROPLASTY ANTERIOR APPROACH     Action/Plan:   discharge planning   Anticipated DC Date:  11/01/2013   Anticipated DC Plan:  [place]  CM consult      [facility] Choice  HOME HEALTH   Choice offered to / List presented to:  C-1 Patient   DME arranged  3-N-1  Vassie Moselle      DME agency  [place] arranged  [place]   Status of service:  Completed, signed off Medicare Important Message given?   (If response is "NO", the following Medicare IM given date fields will be blank) Date Medicare IM given:   Date Additional Medicare IM given:    Discharge Disposition:  [place]  Per UR Regulation:    If discussed at Long Length of Stay Meetings, dates discussed:    Comments:  11/01/13 09:00 CM met with pt in room to offer choice for [facility] services.  Pt chooses [facility] to render HHPT.  [facility] rep on unit and accepts referral.  [facility] DME delivery rep will deliver 3n1, and rolling walker to room prior to discharge. PA completing F2F.  Address and Contact information verified with pt.  No other CM needs communicated.  Mariane Masters, BSN, CM [phone].

## 2013-11-02 ENCOUNTER — Encounter (HOSPITAL_COMMUNITY): Payer: Self-pay | Admitting: Orthopedic Surgery

## 2013-11-06 NOTE — Discharge Summary (Signed)
Physician Discharge Summary  Patient ID: JAVIER MAMONE MRN: [id number] DOB/AGE: [birth year] 48 y.o.  Admit date: 10/31/2013 Discharge date: 11/01/2013   Procedures:  Procedure(s) (LRB): RIGHT TOTAL HIP ARTHROPLASTY ANTERIOR APPROACH (Right)  Attending Physician:  Dr. Paralee Cancel   Admission Diagnoses:   Right hip OA / pain  Discharge Diagnoses:  Active Problems:   S/P total hip arthroplasty   Expected blood loss anemia   Overweight (BMI 25.0-29.9)  Past Medical History  Diagnosis Date  . Hypertension   . Arthritis     RIGHT HIP OA AND PAIN  . Numbness     RIGHT LEG NUMBNESS SINCE NOV 2014 - HAS HAD ULTRASOUND - NO BLOOD CLOT   . Cancer     BASAL CANCER REMOVED LEFT UPPER LIP    HPI: 48 yo female with Right hip pain for years. Pt. Has had prevous treatment by another physician with hip arthroscopy and intraarticular injection with only short term relief. Pt. Saw another physician and was advised she needed THA. Pt seen in our office and felt to need THA right hip. Surgery, risks, benefits and aftercare discussed with pt.  PCP: Carlyn Reichert, MD   Discharged Condition: good  Hospital Course:  Patient underwent the above stated procedure on 10/31/2013. Patient tolerated the procedure well and brought to the recovery room in good condition and subsequently to the floor.  POD #1 BP: 153/96 ; Pulse: 73 ; Temp: 97.9 F (36.6 C) ; Resp: 16 Patient reports pain as mild, pain controlled. No events throughout the night. Ready to be discharged home. Neurovascular intact, dorsiflexion/plantar flexion intact, incision: dressing C/D/I, no cellulitis present and compartment soft.   LABS  Basename    HGB  11.4  HCT  33.9    Discharge Exam: General appearance: alert, cooperative and no distress Extremities: Homans sign is negative, no sign of DVT, no edema, redness or tenderness in the calves or thighs and no ulcers, gangrene or trophic changes  Disposition:    H ome  with  follow up in 2 weeks   Follow-up Information   Follow up with [facility] (home health physical therapy)    Contact information:   [address] [place] [address] [phone]       Follow up with Mauri Pole, MD. Schedule an appointment as soon as possible for a visit in 2 weeks.   Specialty:  Orthopedic Surgery   Contact information:   [address] [place] [address] [phone]       Discharge Orders   Future Orders Complete By Expires   Call MD / Call 911  As directed    Change dressing  As directed    Constipation Prevention  As directed    Diet - low sodium heart healthy  As directed    Discharge instructions  As directed    Driving restrictions  As directed    Increase activity slowly as tolerated  As directed    TED hose  As directed    Weight bearing as tolerated  As directed    Questions:     Laterality:  Right    Extremity:  Lower        Medication List    STOP taking these medications       estradiol 1 MG tablet  Commonly known as:  ESTRACE      TAKE these medications       ALPRAZolam 0.5 MG tablet  Commonly known  as:  XANAX  Take 0.5 mg by mouth 2 (two) times daily as needed for anxiety.     amLODipine 5 MG tablet  Commonly known as:  NORVASC  Take 5 mg by mouth every morning.     aspirin 325 MG EC tablet  Take 1 tablet (325 mg total) by mouth 2 (two) times daily.     DSS 100 MG Caps  Take 100 mg by mouth 2 (two) times daily.     ferrous sulfate 325 (65 FE) MG tablet  Take 1 tablet (325 mg total) by mouth 3 (three) times daily after meals.     HYDROcodone-acetaminophen 7.5-325 MG per tablet  Commonly known as:  NORCO  Take 1-2 tablets by mouth every 4 (four) hours.     methocarbamol 500 MG tablet  Commonly known as:  ROBAXIN  Take 1 tablet (500 mg total) by mouth every 6 (six) hours as needed for muscle spasms.     polyethylene glycol packet  Commonly known as:  MIRALAX / GLYCOLAX   Take 17 g by mouth daily as needed for mild constipation.         Signed: West Pugh. Devario Bucklew   PAC  11/06/2013, 11:04 AM

## 2014-04-13 ENCOUNTER — Other Ambulatory Visit: Payer: Self-pay

## 2015-01-07 ENCOUNTER — Telehealth: Payer: Self-pay | Admitting: Family Medicine

## 2015-01-07 NOTE — Telephone Encounter (Signed)
Pt stated that she is due for a mammogram but when she called to schedule the appt and explained that she is having tenderness in her breast near the her nipple for the last week, that office suggested that Dr. Caryn Section might want to refer pt for a diagnostic mammogram. I advised that pt would need to come in for OV and pt request that we ask Dr. Caryn Section first because it hasn't been that long since she was seen. Thanks TNP

## 2015-01-07 NOTE — Telephone Encounter (Signed)
Se needs an office visit to check this out so we can determine what type of imaging she needs.

## 2015-01-07 NOTE — Telephone Encounter (Signed)
Patient wants to check her insurance before making an appt, to make sure ov will be covered.

## 2015-01-09 ENCOUNTER — Other Ambulatory Visit: Payer: Self-pay | Admitting: Family Medicine

## 2015-01-09 NOTE — Telephone Encounter (Signed)
Please call in the following medication. [facility] [phone]   Surescripts Out Interface  2 hours ago   (5:42 AM)       Requested Medications     Medication name:  Name from pharmacy:  ALPRAZolam (XANAX) 0.5 MG tablet ALPRAZolam 0.5MG  TAB    Sig: TAKE ONE TABLET BY MOUTH ONCE TO TWICE DAILY AS NEEDED    Dispense: 45 tablet   Refills: 2

## 2015-01-09 NOTE — Telephone Encounter (Signed)
Rx phoned into pharmacy.

## 2015-02-05 ENCOUNTER — Ambulatory Visit: Payer: Medicaid Other | Admitting: Family Medicine

## 2015-02-05 DIAGNOSIS — M545 Low back pain, unspecified: Secondary | ICD-10-CM | POA: Insufficient documentation

## 2015-02-05 DIAGNOSIS — M169 Osteoarthritis of hip, unspecified: Secondary | ICD-10-CM | POA: Insufficient documentation

## 2015-02-05 DIAGNOSIS — Z85828 Personal history of other malignant neoplasm of skin: Secondary | ICD-10-CM | POA: Insufficient documentation

## 2015-02-05 DIAGNOSIS — M79604 Pain in right leg: Secondary | ICD-10-CM | POA: Insufficient documentation

## 2015-02-05 DIAGNOSIS — N951 Menopausal and female climacteric states: Secondary | ICD-10-CM | POA: Insufficient documentation

## 2015-03-31 ENCOUNTER — Other Ambulatory Visit: Payer: Self-pay | Admitting: Family Medicine

## 2015-05-02 ENCOUNTER — Other Ambulatory Visit: Payer: Self-pay | Admitting: *Deleted

## 2015-05-02 MED ORDER — ALPRAZOLAM 0.5 MG PO TABS: ORAL_TABLET | ORAL | Status: DC

## 2015-05-02 NOTE — Telephone Encounter (Signed)
Please call in alprazolam.  

## 2015-05-02 NOTE — Telephone Encounter (Signed)
Rx called in to pharmacy. 

## 2015-05-07 ENCOUNTER — Other Ambulatory Visit: Payer: Self-pay | Admitting: Family Medicine

## 2015-05-09 ENCOUNTER — Other Ambulatory Visit: Payer: Self-pay | Admitting: Family Medicine

## 2015-05-09 NOTE — Telephone Encounter (Signed)
Rx was phoned into pharmacy 05/02/2015. Called rx in again.

## 2015-05-09 NOTE — Telephone Encounter (Addendum)
Pt states [facility] has not rec'd the Rx ALPRAZolam (XANAX) 0.5 MG tablet.  Pt is requesting this sent again.  CB#[phone]/MW

## 2015-08-02 ENCOUNTER — Other Ambulatory Visit: Payer: Self-pay | Admitting: Family Medicine

## 2015-08-07 ENCOUNTER — Other Ambulatory Visit: Payer: Self-pay | Admitting: *Deleted

## 2015-08-07 MED ORDER — AMLODIPINE BESYLATE 5 MG PO TABS: 5.0000 mg | ORAL_TABLET | Freq: Every day | ORAL | Status: DC

## 2015-08-07 NOTE — Telephone Encounter (Signed)
Resent rx for amlodipine to [facility] because they did not receivethe original on sent on 08/03/2015.

## 2015-09-06 ENCOUNTER — Encounter: Payer: Medicaid Other | Admitting: Family Medicine

## 2015-09-16 ENCOUNTER — Other Ambulatory Visit: Payer: Self-pay | Admitting: *Deleted

## 2015-09-16 MED ORDER — ALPRAZOLAM 0.5 MG PO TABS: ORAL_TABLET | ORAL | Status: DC

## 2015-09-16 NOTE — Telephone Encounter (Signed)
Rx called in to pharmacy. 

## 2015-09-16 NOTE — Telephone Encounter (Signed)
Please call in alprazolam.  

## 2015-09-18 ENCOUNTER — Encounter: Payer: Self-pay | Admitting: Family Medicine

## 2015-09-18 ENCOUNTER — Ambulatory Visit (INDEPENDENT_AMBULATORY_CARE_PROVIDER_SITE_OTHER): Payer: Federal, State, Local not specified - PPO | Admitting: Family Medicine

## 2015-09-18 VITALS — BP 128/82 | HR 78 | Temp 97.9°F | Resp 16 | Ht 63.0 in | Wt 167.0 lb

## 2015-09-18 DIAGNOSIS — M7741 Metatarsalgia, right foot: Secondary | ICD-10-CM | POA: Diagnosis not present

## 2015-09-18 DIAGNOSIS — Z1239 Encounter for other screening for malignant neoplasm of breast: Secondary | ICD-10-CM

## 2015-09-18 DIAGNOSIS — Z23 Encounter for immunization: Secondary | ICD-10-CM | POA: Diagnosis not present

## 2015-09-18 DIAGNOSIS — I10 Essential (primary) hypertension: Secondary | ICD-10-CM

## 2015-09-18 DIAGNOSIS — F419 Anxiety disorder, unspecified: Secondary | ICD-10-CM

## 2015-09-18 DIAGNOSIS — Z0000 Encounter for general adult medical examination without abnormal findings: Secondary | ICD-10-CM | POA: Diagnosis not present

## 2015-09-18 DIAGNOSIS — N644 Mastodynia: Secondary | ICD-10-CM

## 2015-09-18 NOTE — Progress Notes (Signed)
Patient: Olivia Meyer, Female    DOB: 1965-11-13, 50 y.o.   MRN: [id number] Visit Date: 09/18/2015  Today's Provider: Lelon Huh, MD   Chief Complaint  Patient presents with  . Annual Exam  . Follow-up    Menopausal symptom  . Hypertension    follow up  . Anxiety    follow up   Subjective:    Annual physical exam Olivia Meyer is a 50 y.o. female who presents today for health maintenance and complete physical. She feels fairly well. She reports exercising 3 times a week. She reports she is sleeping poorly.  -----------------------------------------------------------------  Hypertension, follow-up:  BP Readings from Last 3 Encounters:  09/18/15 128/82  10/20/14 122/84  11/01/13 153/96    She was last seen for hypertension 3 years ago.  BP at that visit was 138/88. Management since that visit includes no changes. She reports good compliance with treatment. She is not having side effects.  She is exercising. She is adherent to low salt diet.   Outside blood pressures are not being checked. She is experiencing lower extremity edema.  Patient denies chest pain, chest pressure/discomfort, claudication, dyspnea, exertional chest pressure/discomfort, fatigue, irregular heart beat, near-syncope, orthopnea, palpitations, paroxysmal nocturnal dyspnea, syncope and tachypnea.   Cardiovascular risk factors include hypertension.  Use of agents associated with hypertension: none.     Weight trend: increasing steadily Wt Readings from Last 3 Encounters:  09/18/15 167 lb (75.751 kg)  10/20/14 157 lb (71.215 kg)  10/31/13 156 lb (70.761 kg)    Current diet: in general, a "healthy" diet    ------------------------------------------------------------------------  Follow up Anxiety: Last office visit was 11 months ago and no changes were made. Today patient come in reporting her Anxiety has worsened. Patient states the Xanax helps improve Anxiety. Has been under more  stress with her daughter. Patient reports he daughter is an IV drug user and has a baby. Both her daughter and grandchild live in her home.  Follow up Menopausal Symptoms: Last office visit was 11 months ago and no changes were made. Patient reports she stopped taking the Estradiol 3-4 months ago due to concerns that it could cause breast cancer. Patient states since stopping the Estradiol her hot flashes have worsened and occur more frequently.   Review of Systems  Constitutional: Positive for unexpected weight change. Negative for fever, chills and fatigue.  HENT: Positive for sinus pressure. Negative for congestion, ear pain, rhinorrhea, sneezing and sore throat.        Has polyp of right sinus  Eyes: Negative.  Negative for pain and redness.  Respiratory: Negative for cough, shortness of breath and wheezing.   Cardiovascular: Positive for leg swelling. Negative for chest pain.  Gastrointestinal: Negative for nausea, abdominal pain, diarrhea, constipation and blood in stool.  Endocrine: Negative for polydipsia and polyphagia.  Genitourinary: Negative.  Negative for dysuria, hematuria, flank pain, vaginal bleeding, vaginal discharge and pelvic pain.       Right breast pain x 3-4 months. Has implants  Musculoskeletal: Positive for back pain (right lower back pain) and joint swelling. Negative for gait problem.       Right foot and toe pain  Skin: Negative for rash.  Allergic/Immunologic: Positive for environmental allergies.  Neurological: Positive for numbness (right thigh) and headaches (pressure on left side). Negative for dizziness, tremors, seizures, weakness and light-headedness.  Hematological: Negative for adenopathy.  Psychiatric/Behavioral: Positive for sleep disturbance and decreased concentration. Negative for behavioral problems, confusion  and dysphoric mood. The patient is nervous/anxious. The patient is not hyperactive.     Social History      She  reports that she quit  smoking about 30 years ago. She has never used smokeless tobacco. She reports that she drinks alcohol. She reports that she does not use illicit drugs.       Social History   Social History  . Marital Status: Married    Spouse Name: N/A  . Number of Children: 3  . Years of Education: N/A   Occupational History  . Homemaker    Social History Main Topics  . Smoking status: Former Smoker    Quit date: 06/29/1985  . Smokeless tobacco: Never Used  . Alcohol Use: 0.0 oz/week    0 Standard drinks or equivalent per week     Comment:   WINE ON WEEK ENDS ( MAYBE 4 GLASSES A WEEK )  . Drug Use: No  . Sexual Activity: Not Asked   Other Topics Concern  . None   Social History Narrative    Past Medical History  Diagnosis Date  . Hypertension   . Arthritis     RIGHT HIP OA AND PAIN  . Numbness     RIGHT LEG NUMBNESS SINCE NOV 2014 - HAS HAD ULTRASOUND - NO BLOOD CLOT   . Cancer (HCC)     BASAL CANCER REMOVED LEFT UPPER LIP  . History of chicken pox   . Depression   . Anxiety      Patient Active Problem List   Diagnosis Date Noted  . Hip osteoarthritis 02/05/2015  . History of basal cell cancer 02/05/2015  . Low back pain radiating to right leg 02/05/2015  . Menopausal symptom 02/05/2015  . Expected blood loss anemia 11/01/2013  . Overweight (BMI 25.0-29.9) 11/01/2013  . S/P total hip arthroplasty 10/31/2013  . History of endogenous hypertriglyceridemia 02/01/2009  . Family history of colonic polyps 09/18/2007  . Essential (primary) hypertension 08/22/2007  . Anxiety 06/29/2004  . Depressive disorder 06/29/2004  . History of herpes genitalis 06/29/2004    Past Surgical History  Procedure Laterality Date  . Mohs surgery  2014    LEFT UPPER LIP  . Cesarean section  1987  1993  1997  . Tubal ligation  1999  . Hip arthroscopy Right 2013  . Breast surgery  1998    BREAST AUGMENTATION  . Total hip arthroplasty Right 10/31/2013    Procedure: RIGHT TOTAL HIP ARTHROPLASTY  ANTERIOR APPROACH;  Surgeon: Mauri Pole, MD;  Location: [facility];  Service: Orthopedics;  Laterality: Right;  . 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 Appendectomy  2001    Family History        Family Status  Relation Status Death Age  . Father Deceased     history of Colorectal Adenoma  . Sister Alive     history of colorectal Adenoma  . Mother Alive         Her family history includes Cervical cancer in her mother; Hypertension in her mother; Irritable bowel syndrome in her mother; Kidney failure in her father; Melanoma in her mother.    Allergies  Allergen Reactions  . Ace Inhibitors     COUGH AND THROAT IRRITATION  . Morphine And Related     ITCHING AND SKIN BLOTCHES  . Prednisone     makers her 'crazy'    Previous Medications   ALPRAZOLAM (XANAX) 0.5 MG TABLET  TAKE ONE TABLET BY MOUTH ONCE TO TWICE DAILY AS NEEDED   AMLODIPINE (NORVASC) 5 MG TABLET    Take 1 tablet (5 mg total) by mouth daily.   B COMPLEX VITAMINS PO    Take 1 tablet by mouth daily.   CHOLECALCIFEROL (VITAMIN D-3) 1000 UNITS CAPS    Take 1 capsule by mouth daily.   DOCUSATE SODIUM 100 MG CAPS    Take 100 mg by mouth 2 (two) times daily.   ESTRADIOL (ESTRACE) 1 MG TABLET    Take 1 tablet by mouth daily. Reported on 09/18/2015   FLAXSEED, LINSEED, (FLAXSEED OIL) 1000 MG CAPS    Take 1 capsule by mouth daily.   FLUTICASONE (FLONASE) 50 MCG/ACT NASAL SPRAY    Place 2 sprays into both nostrils daily.   HYDROCHLOROTHIAZIDE (HYDRODIURIL) 25 MG TABLET    TAKE ONE TABLET BY MOUTH ONCE DAILY **NEED  TO  SCHEDULE  OFFICE  VISIT**   MULTIPLE VITAMINS-MINERALS (MULTIVITAMIN ADULT PO)    Take 1 tablet by mouth daily.   OMEGA-3 FATTY ACIDS (FISH OIL) 1000 MG CAPS    Take 1 capsule by mouth daily.   VITAMIN E 1000 UNIT CAPSULE    Take 1 capsule by mouth daily.    Patient Care Team: [name], MD as PCP - General (Family Medicine) Elgie Collard, MD as Referring Physician  (Gynecology) Brayton Mars, MD as Consulting Physician (Obstetrics and Gynecology)     Objective:   Vitals: BP 128/82 mmHg  Pulse 78  Temp(Src) 97.9 F (36.6 C) (Oral)  Resp 16  Ht 5\' 3"  (1.6 m)  Wt 167 lb (75.751 kg)  BMI 29.59 kg/m2  SpO2 98%  LMP    Physical Exam  General Appearance:    Alert, cooperative, no distress, appears stated age, overweight  Head:    Normocephalic, without obvious abnormality, atraumatic  Eyes:    PERRL, conjunctiva/corneas clear, EOM's intact, fundi    benign, both eyes  Ears:    Normal TM's and external ear canals, both ears  Nose:   Nares swollen, septum midline, mucosa normal, no drainage    or sinus tenderness  Throat:   Lips, mucosa, and tongue normal; teeth and gums normal  Neck:   Supple, symmetrical, trachea midline, no adenopathy;    thyroid:  no enlargement/tenderness/nodules; no carotid   bruit or JVD  Back:     Symmetric, no curvature, ROM normal, no CVA tenderness  Lungs:     Clear to auscultation bilaterally, respirations unlabored  Chest Wall:    No tenderness or deformity   Heart:    Regular rate and rhythm, S1 and S2 normal, no murmur, rub   or gallop  Breast Exam:    No masses, mild tenderness inferior to right areola.   Abdomen:     Soft, non-tender, bowel sounds active all four quadrants,    no masses, no organomegaly  Pelvic:    deferred  Extremities:   Extremities normal, atraumatic, no cyanosis or edema  Pulses:   2+ and symmetric all extremities  Skin:   Skin color, texture, turgor normal, no rashes or lesions  Lymph nodes:   Cervical, supraclavicular, and axillary nodes normal  Neurologic:   CNII-XII intact, normal strength, sensation and reflexes    throughout    Depression Screen PHQ 2/9 Scores 09/18/2015  PHQ - 2 Score 0  PHQ- 9 Score 3   Current Exercise Habits: Home exercise routine, Type of exercise: walking, Time (Minutes): 45, Frequency (  Times/Week): 3, Weekly Exercise (Minutes/Week): 135,  Intensity: Moderate Exercise limited by: None identified    Assessment & Plan:     Routine Health Maintenance and Physical Exam  Exercise Activities and Dietary recommendations Goals    None      Immunization History  Administered Date(s) Administered  . Td 06/30/1999    Health Maintenance  Topic Date Due  . HIV Screening  05/23/1981  . PAP SMEAR  05/24/1987  . TETANUS/TDAP  06/29/2009  . INFLUENZA VACCINE  01/28/2015      Discussed health benefits of physical activity, and encouraged her to engage in regular exercise appropriate for her age and condition.    --------------------------------------------------------------------  1. Annual physical exam   2. Essential hypertension Well controlled.  Continue current medications.   - Lipid Panel With LDL/HDL Ratio - EKG 12-Lead - Comprehensive Metabolic Panel (CMET) - TSH  3. Metatarsalgia, right  - Ambulatory referral to Podiatry  4. Mastalgia Diagnostic mammorgram  5. Need for diphtheria-tetanus-pertussis (Tdap) vaccine  - Tdap vaccine greater than or equal to 7yo IM  6. Screening for breast cancer  - MM Digital Diagnostic Bilat; Future  7. Anxiety Stable on current regiment of alprazolam.

## 2015-09-19 LAB — COMPREHENSIVE METABOLIC PANEL
ALT: 56 IU/L — ABNORMAL HIGH (ref 0–32)
AST: 32 IU/L (ref 0–40)
Albumin/Globulin Ratio: 1.9 (ref 1.2–2.2)
Albumin: 5 g/dL (ref 3.5–5.5)
Alkaline Phosphatase: 83 IU/L (ref 39–117)
BUN/Creatinine Ratio: 20 (ref 9–23)
BUN: 13 mg/dL (ref 6–24)
Bilirubin Total: 0.4 mg/dL (ref 0.0–1.2)
CO2: 27 mmol/L (ref 18–29)
Calcium: 10.6 mg/dL — ABNORMAL HIGH (ref 8.7–10.2)
Chloride: 95 mmol/L — ABNORMAL LOW (ref 96–106)
Creatinine, Ser: 0.64 mg/dL (ref 0.57–1.00)
GFR calc Af Amer: 121 mL/min/{1.73_m2} (ref >59)
GFR calc non Af Amer: 105 mL/min/{1.73_m2} (ref >59)
Globulin, Total: 2.7 g/dL (ref 1.5–4.5)
Glucose: 101 mg/dL — ABNORMAL HIGH (ref 65–99)
Potassium: 4.8 mmol/L (ref 3.5–5.2)
Sodium: 140 mmol/L (ref 134–144)
Total Protein: 7.7 g/dL (ref 6.0–8.5)

## 2015-09-19 LAB — LIPID PANEL WITH LDL/HDL RATIO
CHOLESTEROL TOTAL: 227 mg/dL — AB (ref 100–199)
HDL: 58 mg/dL (ref >39)
LDL Calculated: 106 mg/dL — ABNORMAL HIGH (ref 0–99)
LDl/HDL Ratio: 1.8 ratio units (ref 0.0–3.2)
Triglycerides: 314 mg/dL — ABNORMAL HIGH (ref 0–149)
VLDL CHOLESTEROL CAL: 63 mg/dL — AB (ref 5–40)

## 2015-09-19 LAB — TSH: TSH: 2.15 u[IU]/mL (ref 0.450–4.500)

## 2015-09-23 ENCOUNTER — Telehealth: Payer: Self-pay | Admitting: Family Medicine

## 2015-09-23 NOTE — Telephone Encounter (Signed)
Patient was notified of results. Patient expressed understanding. 

## 2015-09-23 NOTE — Telephone Encounter (Signed)
Pt would like a call back to get the results of the labs that were done on 09/18/15. Thanks TNP

## 2015-10-11 DIAGNOSIS — N644 Mastodynia: Secondary | ICD-10-CM | POA: Diagnosis not present

## 2015-10-11 DIAGNOSIS — N63 Unspecified lump in breast: Secondary | ICD-10-CM | POA: Diagnosis not present

## 2015-10-11 DIAGNOSIS — R922 Inconclusive mammogram: Secondary | ICD-10-CM | POA: Diagnosis not present

## 2015-10-24 ENCOUNTER — Ambulatory Visit: Payer: Federal, State, Local not specified - PPO | Admitting: Podiatry

## 2015-11-28 ENCOUNTER — Encounter: Payer: Self-pay | Admitting: Podiatry

## 2015-11-28 ENCOUNTER — Ambulatory Visit (INDEPENDENT_AMBULATORY_CARE_PROVIDER_SITE_OTHER): Payer: Federal, State, Local not specified - PPO

## 2015-11-28 ENCOUNTER — Ambulatory Visit (INDEPENDENT_AMBULATORY_CARE_PROVIDER_SITE_OTHER): Payer: Federal, State, Local not specified - PPO | Admitting: Podiatry

## 2015-11-28 VITALS — BP 163/97 | HR 78 | Resp 18

## 2015-11-28 DIAGNOSIS — R52 Pain, unspecified: Secondary | ICD-10-CM

## 2015-11-28 DIAGNOSIS — M2010 Hallux valgus (acquired), unspecified foot: Secondary | ICD-10-CM

## 2015-11-28 DIAGNOSIS — M779 Enthesopathy, unspecified: Secondary | ICD-10-CM

## 2015-11-28 DIAGNOSIS — M2040 Other hammer toe(s) (acquired), unspecified foot: Secondary | ICD-10-CM

## 2015-11-28 DIAGNOSIS — M722 Plantar fascial fibromatosis: Secondary | ICD-10-CM | POA: Diagnosis not present

## 2015-11-28 MED ORDER — MELOXICAM 15 MG PO TABS: 15.0000 mg | ORAL_TABLET | Freq: Every day | ORAL | Status: DC

## 2015-11-28 NOTE — Progress Notes (Signed)
   Subjective:    Patient ID: Olivia Meyer, female    DOB: 03/14/1966, 50 y.o.   MRN: [id number]  HPI  50 year old female presents the office today for concerns of a bunion her second toe crossing over on her right foot and pain to the second interspace of the second MPJ as well to his been ongoing for quite some time. She states the toes cause discomfort particular shoe gear. She also gets occasional pain in the bottom of her heel which is worse in the morning. She said no recent treatment. No recent injury. No other complaints.  Review of Systems  All other systems reviewed and are negative.      Objective:   Physical Exam General: AAO x3, NAD  Dermatological: Skin is warm, dry and supple bilateral. Nails x 10 are well manicured; remaining integument appears unremarkable at this time. There are no open sores, no preulcerative lesions, no rash or signs of infection present.  Vascular: Dorsalis Pedis artery and Posterior Tibial artery pedal pulses are 2/4 bilateral with immedate capillary fill time. Pedal hair growth present. No varicosities and no lower extremity edema present bilateral. There is no pain with calf compression, swelling, warmth, erythema.   Neruologic: Grossly intact via light touch bilateral. Vibratory intact via tuning fork bilateral. Protective threshold with Semmes Wienstein monofilament intact to all pedal sites bilateral. Patellar and Achilles deep tendon reflexes 2+ bilateral. No Babinski or clonus noted bilateral.   Musculoskeletal: HAV is present as well as hammertoes of the second digit overlapping the hallux. There is prominence metatarsal heads plantarly atrophy of fat pad. There is tenderness along the submetatarsal 2 on the right foot. There is discomfort in the second interspace and just lateral to the second MPJ as well. There is mild discomfort on the plantar medial tubercle of the calcaneus at the insertion upon the fascia. There is no pain with course the  plantar fashion the arch of the foot. No pain with medial to lateral compression. No other areas of tenderness. No edema, erythema. MMT 5/5. Mild equinus.  Gait: Unassisted, Nonantalgic.      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 HAV hammertoe. No evidence of acute fracture. -Discussed steroid injection on the maximal tenderness just lateral to the second MPJ on the second interspace. Understanding risks and complications she wishes to proceed. Under sterile conditions a mixture Dexon is an phosphate and local anesthetic was infiltrated without complications. Post injection care was discussed.  -Offloading pads were dispensed. -Prescribed mobic. Discussed side effects of the medication and directed to stop if any are to occur and call the office.  -Discussed shoe gear modifications and orthotics. 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 -Stretching, icing for the heel pain as well. -F/U as scheduled  Celesta Gentile, DPM

## 2015-12-25 ENCOUNTER — Other Ambulatory Visit: Payer: Self-pay | Admitting: Family Medicine

## 2015-12-26 ENCOUNTER — Ambulatory Visit (INDEPENDENT_AMBULATORY_CARE_PROVIDER_SITE_OTHER): Payer: Federal, State, Local not specified - PPO | Admitting: Podiatry

## 2015-12-26 ENCOUNTER — Encounter: Payer: Self-pay | Admitting: Podiatry

## 2015-12-26 DIAGNOSIS — M779 Enthesopathy, unspecified: Secondary | ICD-10-CM | POA: Diagnosis not present

## 2015-12-26 DIAGNOSIS — M2010 Hallux valgus (acquired), unspecified foot: Secondary | ICD-10-CM | POA: Diagnosis not present

## 2015-12-26 DIAGNOSIS — M7740 Metatarsalgia, unspecified foot: Secondary | ICD-10-CM | POA: Diagnosis not present

## 2015-12-27 ENCOUNTER — Other Ambulatory Visit: Payer: Self-pay | Admitting: Family Medicine

## 2015-12-27 ENCOUNTER — Other Ambulatory Visit: Payer: Self-pay | Admitting: *Deleted

## 2015-12-27 DIAGNOSIS — M2010 Hallux valgus (acquired), unspecified foot: Secondary | ICD-10-CM | POA: Insufficient documentation

## 2015-12-27 NOTE — Telephone Encounter (Signed)
Rx already sent to pharmacy.

## 2015-12-27 NOTE — Progress Notes (Signed)
Patient ID: Olivia Meyer, female   DOB: 1966-04-16, 50 y.o.   MRN: [id number]  Subjective: 50 year old female presents the office today for follow-up evaluation of pain in the [place] of her foot. She says the pain is greatly resolved and she is having no pain at this time. She does that she has noticed her bunion more. She has tried changing shoes and she states that around to times. She will pursue a surgical intervention in the future if her symptoms continue.Denies any systemic complaints such as fevers, chills, nausea, vomiting. No acute changes since last appointment, and no other complaints at this time.   Objective: AAO x3, NAD DP/PT pulses palpable bilaterally, CRT less than 3 seconds Moderate HAV is present. There is slight erythema on the medial aspect of the first MTPJ on the right side due to irritation shoes. There is no pain or crepitation first MPJ range of motion. There does appear to be hypermobility to the first metatarsal cuneiform joint. This time there is no tenderness to palpation along the metatarsal heads plantarly.  No open lesions or pre-ulcerative lesions.  No pain with calf compression, swelling, warmth, erythema  Assessment:  HAV, metatarsalgia   Plan: -All treatment options discussed with the patient including all alternatives, risks, complications.  -Discussed surgical options for bunion surgery. Given her x-ray findings likely proceed with Lapidus as well as given hypermobility. She'll likely pursue this in the fall.  -Continue metatarsal offloading pads  -Discussed orthotics  -Follow-up at her discretion.  -Patient encouraged to call the office with any questions, concerns, change in symptoms.   Celesta Gentile, DPM

## 2016-01-07 ENCOUNTER — Other Ambulatory Visit: Payer: Self-pay | Admitting: Family Medicine

## 2016-01-07 MED ORDER — HYDROCHLOROTHIAZIDE 25 MG PO TABS: 25.0000 mg | ORAL_TABLET | Freq: Every day | ORAL | Status: DC

## 2016-01-08 ENCOUNTER — Other Ambulatory Visit: Payer: Self-pay | Admitting: Family Medicine

## 2016-02-03 ENCOUNTER — Other Ambulatory Visit: Payer: Self-pay | Admitting: *Deleted

## 2016-02-03 MED ORDER — ALPRAZOLAM 0.5 MG PO TABS: ORAL_TABLET | ORAL | 3 refills | Status: DC

## 2016-02-03 NOTE — Telephone Encounter (Signed)
Please call in alprazolam.  

## 2016-02-13 DIAGNOSIS — K080 Exfoliation of teeth due to systemic causes: Secondary | ICD-10-CM | POA: Diagnosis not present

## 2016-03-03 DIAGNOSIS — K080 Exfoliation of teeth due to systemic causes: Secondary | ICD-10-CM | POA: Diagnosis not present

## 2016-03-09 ENCOUNTER — Other Ambulatory Visit: Payer: Self-pay | Admitting: *Deleted

## 2016-03-09 MED ORDER — AMLODIPINE BESYLATE 5 MG PO TABS: 5.0000 mg | ORAL_TABLET | Freq: Every day | ORAL | 12 refills | Status: DC

## 2016-03-19 DIAGNOSIS — S0501XA Injury of conjunctiva and corneal abrasion without foreign body, right eye, initial encounter: Secondary | ICD-10-CM | POA: Diagnosis not present

## 2016-04-13 ENCOUNTER — Ambulatory Visit (INDEPENDENT_AMBULATORY_CARE_PROVIDER_SITE_OTHER): Payer: Federal, State, Local not specified - PPO | Admitting: Family Medicine

## 2016-04-13 ENCOUNTER — Encounter: Payer: Self-pay | Admitting: Family Medicine

## 2016-04-13 VITALS — BP 116/88 | HR 69 | Temp 97.7°F | Resp 16 | Wt 176.8 lb

## 2016-04-13 DIAGNOSIS — L237 Allergic contact dermatitis due to plants, except food: Secondary | ICD-10-CM

## 2016-04-13 DIAGNOSIS — T63481A Toxic effect of venom of other arthropod, accidental (unintentional), initial encounter: Secondary | ICD-10-CM | POA: Diagnosis not present

## 2016-04-13 MED ORDER — PREDNISONE 20 MG PO TABS: ORAL_TABLET | ORAL | 0 refills | Status: DC

## 2016-04-13 NOTE — Progress Notes (Signed)
Subjective:     Patient ID: Olivia Meyer, female   DOB: Mar 13, 1966, 50 y.o.   MRN: [id number]  HPI  Chief Complaint  Patient presents with  . Rash    Patient comes in office today with cocnerns of exposure to poison oak . 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 Patient has been applying calomine lotion with no relief.   States her exposure was 9 days ago. Thought it was getting better until she was stung by wasps on her buttock and her dermatitis seemed to flare on her legs and between her breast where she had incidental contact.   Review of Systems     Objective:   Physical Exam  Constitutional: She appears well-developed and well-nourished. No distress.  Skin:  Papules in the area noted above in both a discrete and linear pattern. Rash between breasts is patchy.       Assessment:    1. Allergic contact dermatitis due to plants, except food - predniSONE (DELTASONE) 20 MG tablet; Taper as follows: 3 pills for 4 days, two pills for 4 days, one pill for four days  Dispense: 24 tablet; Refill: 0  2. Insect stings, accidental or unintentional, initial encounter - predniSONE (DELTASONE) 20 MG tablet; Taper as follows: 3 pills for 4 days, two pills for 4 days, one pill for four days  Dispense: 24 tablet; Refill: 0    Plan:    Continue calamine; stop other topical medication.

## 2016-04-13 NOTE — Patient Instructions (Signed)
May continue Calamine as needed.

## 2016-06-10 ENCOUNTER — Other Ambulatory Visit: Payer: Self-pay | Admitting: *Deleted

## 2016-06-10 MED ORDER — ALPRAZOLAM 0.5 MG PO TABS: ORAL_TABLET | ORAL | 3 refills | Status: DC

## 2016-06-10 NOTE — Telephone Encounter (Signed)
Please call in alprazolam.  

## 2016-06-10 NOTE — Telephone Encounter (Signed)
Rx called in to pharmacy. 

## 2016-07-08 DIAGNOSIS — Z01419 Encounter for gynecological examination (general) (routine) without abnormal findings: Secondary | ICD-10-CM | POA: Diagnosis not present

## 2016-07-08 DIAGNOSIS — Z9189 Other specified personal risk factors, not elsewhere classified: Secondary | ICD-10-CM | POA: Diagnosis not present

## 2016-07-08 DIAGNOSIS — N361 Urethral diverticulum: Secondary | ICD-10-CM | POA: Diagnosis not present

## 2016-07-08 DIAGNOSIS — R030 Elevated blood-pressure reading, without diagnosis of hypertension: Secondary | ICD-10-CM | POA: Diagnosis not present

## 2016-07-08 LAB — HM PAP SMEAR: HM PAP: NEGATIVE

## 2016-07-08 LAB — RESULTS CONSOLE HPV: CHL HPV: NEGATIVE

## 2016-07-09 ENCOUNTER — Encounter: Payer: Self-pay | Admitting: Family Medicine

## 2016-07-09 ENCOUNTER — Ambulatory Visit (INDEPENDENT_AMBULATORY_CARE_PROVIDER_SITE_OTHER): Payer: Federal, State, Local not specified - PPO | Admitting: Family Medicine

## 2016-07-09 VITALS — BP 140/90 | HR 75 | Temp 97.9°F | Resp 16 | Wt 179.0 lb

## 2016-07-09 DIAGNOSIS — H6691 Otitis media, unspecified, right ear: Secondary | ICD-10-CM

## 2016-07-09 MED ORDER — AMOXICILLIN 500 MG PO CAPS: 1000.0000 mg | ORAL_CAPSULE | Freq: Three times a day (TID) | ORAL | 0 refills | Status: AC

## 2016-07-09 NOTE — Progress Notes (Signed)
Patient: Olivia Meyer Female    DOB: 05/07/1966   51 y.o.   MRN: [id number] Visit Date: 07/09/2016  Today's Provider: Lelon Huh, MD   Chief Complaint  Patient presents with  . Cough  . Sinusitis   Subjective:    Cough  This is a recurrent problem. Episode onset: 7 days. The problem has been gradually worsening. The cough is productive of sputum (clear sputum). Associated symptoms include ear congestion (right ear), ear pain (right ear), headaches (left side), myalgias, nasal congestion, postnasal drip and a sore throat. Pertinent negatives include no chest pain, chills, eye redness, fever, heartburn, hemoptysis, rhinorrhea, shortness of breath, sweats or wheezing. She has tried nothing for the symptoms.   States she first started having cough in November which was productive of green sputum. This mostly cleared up but then developed persistent sinus congestion and drainage. For last week has cough has been getting worse and right ear and throat have been hurting.     Allergies  Allergen Reactions  . Ace Inhibitors     COUGH AND THROAT IRRITATION  . Morphine And Related     ITCHING AND SKIN BLOTCHES  . Prednisone     makers her 'crazy'     Current Outpatient Prescriptions:  .  ALPRAZolam (XANAX) 0.5 MG tablet, TAKE ONE TABLET BY MOUTH ONCE TO TWICE DAILY AS NEEDED, Disp: 45 tablet, Rfl: 3 .  amLODipine (NORVASC) 5 MG tablet, Take 1 tablet (5 mg total) by mouth daily., Disp: 30 tablet, Rfl: 12 .  B COMPLEX VITAMINS PO, Take 1 tablet by mouth daily., Disp: , Rfl:  .  Cholecalciferol (VITAMIN D-3) 1000 UNITS CAPS, Take 1 capsule by mouth daily., Disp: , Rfl:  .  estradiol (ESTRACE) 1 MG tablet, Take 1 tablet by mouth daily. Reported on 09/18/2015, Disp: , Rfl:  .  Flaxseed, Linseed, (FLAXSEED OIL) 1000 MG CAPS, Take 1 capsule by mouth daily., Disp: , Rfl:  .  fluticasone (FLONASE) 50 MCG/ACT nasal spray, Place 2 sprays into both nostrils daily., Disp: , Rfl:  .   hydrochlorothiazide (HYDRODIURIL) 25 MG tablet, Take 1 tablet (25 mg total) by mouth daily., Disp: 30 tablet, Rfl: 12 .  meloxicam (MOBIC) 15 MG tablet, Take 1 tablet (15 mg total) by mouth daily., Disp: 30 tablet, Rfl: 2 .  Multiple Vitamins-Minerals (MULTIVITAMIN ADULT PO), Take 1 tablet by mouth daily., Disp: , Rfl:  .  Omega-3 Fatty Acids (FISH OIL) 1000 MG CAPS, Take 1 capsule by mouth daily., Disp: , Rfl:  .  vitamin E 1000 UNIT capsule, Take 1 capsule by mouth daily., Disp: , Rfl:   Review of Systems  Constitutional: Negative for appetite change, chills, fatigue and fever.  HENT: Positive for congestion, ear pain (right ear), mouth sores, postnasal drip, sinus pain, sinus pressure and sore throat. Negative for nosebleeds, rhinorrhea and sneezing.   Eyes: Negative for photophobia, pain, discharge and redness.  Respiratory: Positive for cough. Negative for hemoptysis, chest tightness, shortness of breath and wheezing.   Cardiovascular: Negative for chest pain and palpitations.  Gastrointestinal: Negative for abdominal pain, heartburn, nausea and vomiting.  Musculoskeletal: Positive for myalgias.  Neurological: Positive for headaches (left side). Negative for dizziness and weakness.    Social History  Substance Use Topics  . Smoking status: Former Smoker    Quit date: 06/29/1985  . Smokeless tobacco: Never Used  . Alcohol use 0.0 oz/week     Comment:   WINE ON WEEK ENDS (  MAYBE 4 GLASSES A WEEK )   Objective:   BP 140/90 (BP Location: Left Arm, Patient Position: Sitting, Cuff Size: Large)   Pulse 75   Temp 97.9 F (36.6 C) (Oral)   Resp 16   Wt 179 lb (81.2 kg)   SpO2 98% Comment: room air  BMI 31.71 kg/m   Physical Exam  General Appearance:    Alert, cooperative, no distress  HENT:   left TM normal without fluid or infection, right TM red, dull, bulging, right TM fluid noted, neck has right anterior cervical nodes enlarged, right frontal sinus tender and nasal mucosa  congested  Eyes:    PERRL, conjunctiva/corneas clear, EOM's intact       Lungs:     Clear to auscultation bilaterally, respirations unlabored  Heart:    Regular rate and rhythm  Neurologic:   Awake, alert, oriented x 3. No apparent focal neurological           defect.           Assessment & Plan:     1. Right otitis media, unspecified otitis media type  - amoxicillin (AMOXIL) 500 MG capsule; Take 2 capsules (1,000 mg total) by mouth 3 (three) times daily.  Dispense: 30 capsule; Refill: 0     The entirety of the information documented in the History of Present Illness, Review of Systems and Physical Exam were personally obtained by me. Portions of this information were initially documented by Meyer Cory, CMA and reviewed by me for thoroughness and accuracy.    Lelon Huh, MD  [facility]

## 2016-08-26 ENCOUNTER — Encounter: Payer: Self-pay | Admitting: Podiatry

## 2016-08-26 ENCOUNTER — Ambulatory Visit (INDEPENDENT_AMBULATORY_CARE_PROVIDER_SITE_OTHER): Payer: Federal, State, Local not specified - PPO

## 2016-08-26 ENCOUNTER — Ambulatory Visit (INDEPENDENT_AMBULATORY_CARE_PROVIDER_SITE_OTHER): Payer: Federal, State, Local not specified - PPO | Admitting: Podiatry

## 2016-08-26 DIAGNOSIS — M7741 Metatarsalgia, right foot: Secondary | ICD-10-CM

## 2016-08-26 DIAGNOSIS — M2011 Hallux valgus (acquired), right foot: Secondary | ICD-10-CM

## 2016-08-26 NOTE — Progress Notes (Signed)
She presents today with her friend Olivia Meyer with a chief complaint of a painful bunion second metatarsal phalangeal joint and right heel. She states that this is been going on now since June of last year or even earlier and is starting to bother my hip which I had replaced. 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 She states that is impossible for her to wear regular shoe gear and for her to walk normally with a normal heel-to-toe gait cause of the hip but also because of the pain in the heel and in the forefoot.  Objective: I have reviewed her past medical history medications allergies surgery social history and review of systems. Pulses are strongly palpable. Neurologic sensorium is intact per Semmes-Weinstein monofilament. Deep tendon reflexes are intact muscle strength is normal. Orthopedic evaluation demonstrates mild increase in the first metatarsal angle with little hypermobility. She has good range of motion of the first metatarsophalangeal joint with pain on palpation of medial calcaneal tubercle. She also relates pain on palpation and in range of motion of the second metatarsophalangeal joint of the right foot. Similar findings are noted to the left foot with the exception of the fasciitis and the severity. No open lesions or wounds are noted.  Assessment: Chronic intractable plantar fasciitis right foot. Hallux abductovalgus deformity right foot. Capsulitis second metatarsophalangeal joint right foot.  Plan: We discussed the etiology pathology conservative versus surgical therapies. At this point she would like to consider surgical intervention due to the inability to rid herself of the symptoms. We consented her today for surgical reconstruction consisting of an Austin bunion repair with screw fixation second metatarsal osteotomy with double screw fixation and a endoscopic plantar fasciotomy of the right foot. I answered all the questions regarding these procedures to the best of my ability in  layman's terms discussing possible postop complications which may include but are not limited to postop pain bleeding swelling infection recurrence and need for further surgery. She understands this and is amenable to it we discussed anesthesia and however will be performed and we discussed the surgery Center in [place] where the surgery will take place. The [facility] suspense for her postop recovery period.

## 2016-08-26 NOTE — Patient Instructions (Signed)

## 2016-08-31 ENCOUNTER — Encounter: Payer: Self-pay | Admitting: Family Medicine

## 2016-08-31 ENCOUNTER — Ambulatory Visit (INDEPENDENT_AMBULATORY_CARE_PROVIDER_SITE_OTHER): Payer: Federal, State, Local not specified - PPO | Admitting: Family Medicine

## 2016-08-31 VITALS — BP 120/68 | HR 80 | Temp 97.6°F | Resp 16 | Wt 176.0 lb

## 2016-08-31 DIAGNOSIS — R51 Headache: Secondary | ICD-10-CM | POA: Diagnosis not present

## 2016-08-31 DIAGNOSIS — R42 Dizziness and giddiness: Secondary | ICD-10-CM | POA: Diagnosis not present

## 2016-08-31 DIAGNOSIS — R519 Headache, unspecified: Secondary | ICD-10-CM

## 2016-08-31 NOTE — Progress Notes (Signed)
Patient: Olivia Meyer Female    DOB: 1965/12/26   51 y.o.   MRN: [id number] Visit Date: 08/31/2016  Today's Provider: Lelon Huh, MD   Chief Complaint  Patient presents with  . Dizziness  . Headache   Subjective:    HPI Pt is here today for headache and dizziness for about 6 weeks now. She reports that she has pain behind her left eye socket and left side of her head. She can feel her heart beat in the eye sometimes when she lays down at night. She also has pressure on this side of her head as well. Denies any congestion "more than normal". She has been using the heating pad and hot baths and that seems to make her symptoms better. She take flonase daily. She had a friend recently die from an aneurysm and she wants to make sure this is not signs of that.  Pt has not weakness in arms, legs or slurred speech. Denies any chest pain, shortness of breath. However she does mention that she felt her heart rate increase and felt like she could see her heart beat in her scarf. She also says she has some numbness under her left eye. Pt reports that she has been checking her BP at home and it has been running "decent" she has been outside (weather permitting) doing yard work.     Allergies  Allergen Reactions  . Ace Inhibitors     COUGH AND THROAT IRRITATION  . Morphine And Related     ITCHING AND SKIN BLOTCHES  . Prednisone     makers her 'crazy'     Current Outpatient Prescriptions:  .  ALPRAZolam (XANAX) 0.5 MG tablet, TAKE ONE TABLET BY MOUTH ONCE TO TWICE DAILY AS NEEDED, Disp: 45 tablet, Rfl: 3 .  amLODipine (NORVASC) 5 MG tablet, Take 1 tablet (5 mg total) by mouth daily., Disp: 30 tablet, Rfl: 12 .  B COMPLEX VITAMINS PO, Take 1 tablet by mouth daily., Disp: , Rfl:  .  Cholecalciferol (VITAMIN D-3) 1000 UNITS CAPS, Take 1 capsule by mouth daily., Disp: , Rfl:  .  Flaxseed, Linseed, (FLAXSEED OIL) 1000 MG CAPS, Take 1 capsule by mouth daily., Disp: , Rfl:  .  fluticasone  (FLONASE) 50 MCG/ACT nasal spray, Place 2 sprays into both nostrils daily., Disp: , Rfl:  .  hydrochlorothiazide (HYDRODIURIL) 25 MG tablet, Take 1 tablet (25 mg total) by mouth daily., Disp: 30 tablet, Rfl: 12 .  Multiple Vitamins-Minerals (MULTIVITAMIN ADULT PO), Take 1 tablet by mouth daily., Disp: , Rfl:  .  Omega-3 Fatty Acids (FISH OIL) 1000 MG CAPS, Take 1 capsule by mouth daily., Disp: , Rfl:  .  vitamin E 1000 UNIT capsule, Take 1 capsule by mouth daily., Disp: , Rfl:  .  estradiol (ESTRACE) 1 MG tablet, Take 1 tablet by mouth daily. Reported on 09/18/2015, Disp: , Rfl:  .  meloxicam (MOBIC) 15 MG tablet, Take 1 tablet (15 mg total) by mouth daily. (Patient not taking: Reported on 08/31/2016), Disp: 30 tablet, Rfl: 2  Review of Systems  Constitutional: Negative.   HENT: Positive for sinus pressure.   Eyes: Positive for pain.  Respiratory: Negative.   Cardiovascular: Negative.   Gastrointestinal: Negative.   Endocrine: Negative.   Genitourinary: Negative.   Musculoskeletal: Negative.   Skin: Negative.   Allergic/Immunologic: Negative.   Neurological: Positive for dizziness, numbness and headaches.  Hematological: Negative.   Psychiatric/Behavioral: Negative.     Social  History  Substance Use Topics  . Smoking status: Former Smoker    Quit date: 06/29/1985  . Smokeless tobacco: Never Used  . Alcohol use 0.0 oz/week     Comment:   WINE ON WEEK ENDS ( MAYBE 4 GLASSES A WEEK )   Objective:   BP 120/68 (BP Location: Right Arm, Patient Position: Sitting, Cuff Size: Large)   Pulse 80   Temp 97.6 F (36.4 C) (Oral)   Resp 16   Wt 176 lb (79.8 kg)   SpO2 97%   BMI 31.18 kg/m  Vitals:   08/31/16 1554  BP: 120/68  Pulse: 80  Resp: 16  Temp: 97.6 F (36.4 C)  TempSrc: Oral  SpO2: 97%  Weight: 176 lb (79.8 kg)  .   Physical Exam   General Appearance:    Alert, cooperative, no distress  Eyes:    PERRL, conjunctiva/corneas clear, EOM's intact       Lungs:     Clear to  auscultation bilaterally, respirations unlabored  Heart:    Regular rate and rhythm  Neurologic:   Awake, alert, oriented x 3. No apparent focal neurological           defect.           Assessment & Plan:     1. Dizziness  - CBC - Comprehensive metabolic panel - TSH  2. Nonintractable headache, unspecified chronicity pattern, unspecified headache type Consider head CT after reviewing lab results.        Lelon Huh, MD  [facility]

## 2016-09-01 ENCOUNTER — Telehealth: Payer: Self-pay

## 2016-09-01 DIAGNOSIS — R519 Headache, unspecified: Secondary | ICD-10-CM

## 2016-09-01 DIAGNOSIS — R51 Headache: Principal | ICD-10-CM

## 2016-09-01 DIAGNOSIS — R42 Dizziness and giddiness: Secondary | ICD-10-CM

## 2016-09-01 LAB — TSH: TSH: 1.51 u[IU]/mL (ref 0.450–4.500)

## 2016-09-01 LAB — COMPREHENSIVE METABOLIC PANEL
ALT: 43 IU/L — ABNORMAL HIGH (ref 0–32)
AST: 30 IU/L (ref 0–40)
Albumin/Globulin Ratio: 1.6 (ref 1.2–2.2)
Albumin: 4.5 g/dL (ref 3.5–5.5)
Alkaline Phosphatase: 83 IU/L (ref 39–117)
BUN/Creatinine Ratio: 26 — ABNORMAL HIGH (ref 9–23)
BUN: 17 mg/dL (ref 6–24)
Bilirubin Total: <0.2 mg/dL (ref 0.0–1.2)
CO2: 26 mmol/L (ref 18–29)
Calcium: 9.8 mg/dL (ref 8.7–10.2)
Chloride: 97 mmol/L (ref 96–106)
Creatinine, Ser: 0.65 mg/dL (ref 0.57–1.00)
GFR calc non Af Amer: 104 mL/min/{1.73_m2} (ref >59)
GFR, EST AFRICAN AMERICAN: 120 mL/min/{1.73_m2} (ref >59)
GLUCOSE: 109 mg/dL — AB (ref 65–99)
Globulin, Total: 2.8 g/dL (ref 1.5–4.5)
Potassium: 3.7 mmol/L (ref 3.5–5.2)
SODIUM: 143 mmol/L (ref 134–144)
TOTAL PROTEIN: 7.3 g/dL (ref 6.0–8.5)

## 2016-09-01 LAB — CBC
HEMOGLOBIN: 14.7 g/dL (ref 11.1–15.9)
Hematocrit: 41.6 % (ref 34.0–46.6)
MCH: 30.6 pg (ref 26.6–33.0)
MCHC: 35.3 g/dL (ref 31.5–35.7)
MCV: 87 fL (ref 79–97)
Platelets: 351 10*3/uL (ref 150–379)
RBC: 4.81 x10E6/uL (ref 3.77–5.28)
RDW: 12.7 % (ref 12.3–15.4)
WBC: 8.9 10*3/uL (ref 3.4–10.8)

## 2016-09-01 NOTE — Telephone Encounter (Signed)
-----   Message from [name], MD sent at 09/01/2016  7:51 AM EST ----- Labs are normal. Need to go ahead and order CT head with and without contrast for dizziness and headaches.

## 2016-09-01 NOTE — Telephone Encounter (Signed)
Advised patient as below. Order for CT of the head w/wo contrast was placed.

## 2016-09-08 ENCOUNTER — Ambulatory Visit: Admission: RE | Admit: 2016-09-08 | Payer: Federal, State, Local not specified - PPO | Source: Ambulatory Visit

## 2016-09-15 ENCOUNTER — Telehealth: Payer: Self-pay | Admitting: *Deleted

## 2016-09-15 NOTE — Telephone Encounter (Signed)
"  I'm a patient of Dr. Milinda Pointer in the [place] office.  I saw him a couple of weeks ago.  He told me to call you to schedule my surgery."  Do you have a date in mind that you would like to do it?  "I'd like to do it whenever his next available date is."  He can do it on 10/23/2016.  "That date will be fine."  You can register with the surgical center now.  You cannot eat or drink anything after midnight the night before.  Someone from the surgical center will call you with an arrival time a day or two prior to surgery date.

## 2016-09-15 NOTE — Telephone Encounter (Signed)
Olivia Meyer called patient with an estimate.  She has a $5500 deductible so she canceled surgery.

## 2016-09-22 DIAGNOSIS — D2239 Melanocytic nevi of other parts of face: Secondary | ICD-10-CM | POA: Diagnosis not present

## 2016-09-22 DIAGNOSIS — D225 Melanocytic nevi of trunk: Secondary | ICD-10-CM | POA: Diagnosis not present

## 2016-09-22 DIAGNOSIS — L738 Other specified follicular disorders: Secondary | ICD-10-CM | POA: Diagnosis not present

## 2016-09-22 DIAGNOSIS — Z85828 Personal history of other malignant neoplasm of skin: Secondary | ICD-10-CM | POA: Diagnosis not present

## 2016-10-13 ENCOUNTER — Other Ambulatory Visit: Payer: Self-pay | Admitting: *Deleted

## 2016-10-13 MED ORDER — ALPRAZOLAM 0.5 MG PO TABS: ORAL_TABLET | ORAL | 3 refills | Status: DC

## 2016-10-13 NOTE — Telephone Encounter (Signed)
Please call in alprazolam.  

## 2016-12-16 DIAGNOSIS — K080 Exfoliation of teeth due to systemic causes: Secondary | ICD-10-CM | POA: Diagnosis not present

## 2017-01-18 ENCOUNTER — Encounter (INDEPENDENT_AMBULATORY_CARE_PROVIDER_SITE_OTHER): Payer: Federal, State, Local not specified - PPO | Admitting: Podiatry

## 2017-01-18 NOTE — Progress Notes (Signed)
This encounter was created in error - please disregard.

## 2017-02-10 ENCOUNTER — Other Ambulatory Visit: Payer: Self-pay | Admitting: Family Medicine

## 2017-02-10 MED ORDER — ALPRAZOLAM 0.5 MG PO TABS: ORAL_TABLET | ORAL | 3 refills | Status: DC

## 2017-02-10 NOTE — Telephone Encounter (Signed)
Pt contacted office for refill request on the following medications:  ALPRAZolam (XANAX) 0.5 MG tablet  Pt is requesting 60 tablets due to starting a new job.  [place]  CB#[phone]/MW

## 2017-02-10 NOTE — Telephone Encounter (Signed)
Please advise 

## 2017-02-10 NOTE — Telephone Encounter (Signed)
RX called in at [facility]. Left patient a message advising her that RX has been called in.

## 2017-02-10 NOTE — Telephone Encounter (Signed)
Please call in alprazolam.  

## 2017-02-19 ENCOUNTER — Other Ambulatory Visit: Payer: Self-pay | Admitting: Family Medicine

## 2017-03-11 ENCOUNTER — Telehealth: Payer: Self-pay

## 2017-03-11 DIAGNOSIS — R42 Dizziness and giddiness: Secondary | ICD-10-CM

## 2017-03-11 DIAGNOSIS — R51 Headache: Secondary | ICD-10-CM

## 2017-03-11 DIAGNOSIS — R519 Headache, unspecified: Secondary | ICD-10-CM

## 2017-03-11 NOTE — Telephone Encounter (Signed)
Patient is requesting to have CT head reordered. She had the CT scheduled for early this year, but canceled because she thought her insurance copay was going to be $1500. She states the headache has not improved and wants to go ahead and have the CT done. CB# [phone]

## 2017-03-15 NOTE — Telephone Encounter (Signed)
Order entered

## 2017-03-15 NOTE — Telephone Encounter (Signed)
Please advise 

## 2017-03-18 ENCOUNTER — Ambulatory Visit
Admission: RE | Admit: 2017-03-18 | Discharge: 2017-03-18 | Disposition: A | Discharge status: Home / Self Care | Payer: Federal, State, Local not specified - PPO | Source: Ambulatory Visit | Attending: Family Medicine | Admitting: Family Medicine

## 2017-03-18 DIAGNOSIS — H538 Other visual disturbances: Secondary | ICD-10-CM | POA: Diagnosis not present

## 2017-03-18 DIAGNOSIS — R519 Headache, unspecified: Secondary | ICD-10-CM

## 2017-03-18 DIAGNOSIS — R42 Dizziness and giddiness: Secondary | ICD-10-CM | POA: Diagnosis not present

## 2017-03-18 DIAGNOSIS — R51 Headache: Secondary | ICD-10-CM | POA: Insufficient documentation

## 2017-03-18 DIAGNOSIS — G8929 Other chronic pain: Secondary | ICD-10-CM

## 2017-03-18 MED ORDER — IOPAMIDOL (ISOVUE-300) INJECTION 61%
75.0000 mL | Freq: Once | INTRAVENOUS | Status: AC | PRN
  Administered 2017-03-18: 75 mL via INTRAVENOUS

## 2017-04-02 ENCOUNTER — Other Ambulatory Visit: Payer: Self-pay | Admitting: Family Medicine

## 2017-04-07 ENCOUNTER — Encounter: Payer: Self-pay | Admitting: Family Medicine

## 2017-04-26 NOTE — Telephone Encounter (Signed)
errror

## 2017-04-26 NOTE — Telephone Encounter (Signed)
error 

## 2017-06-15 DIAGNOSIS — K080 Exfoliation of teeth due to systemic causes: Secondary | ICD-10-CM | POA: Diagnosis not present

## 2017-06-21 ENCOUNTER — Encounter: Payer: Self-pay | Admitting: Family Medicine

## 2017-06-21 ENCOUNTER — Other Ambulatory Visit: Payer: Self-pay | Admitting: Family Medicine

## 2017-07-26 ENCOUNTER — Encounter: Payer: Self-pay | Admitting: Family Medicine

## 2017-07-27 ENCOUNTER — Telehealth: Payer: Self-pay | Admitting: Family Medicine

## 2017-07-27 DIAGNOSIS — R42 Dizziness and giddiness: Secondary | ICD-10-CM | POA: Insufficient documentation

## 2017-07-27 DIAGNOSIS — R519 Headache, unspecified: Secondary | ICD-10-CM | POA: Insufficient documentation

## 2017-07-27 DIAGNOSIS — R51 Headache: Secondary | ICD-10-CM

## 2017-08-04 DIAGNOSIS — K080 Exfoliation of teeth due to systemic causes: Secondary | ICD-10-CM | POA: Diagnosis not present

## 2017-09-07 ENCOUNTER — Ambulatory Visit: Payer: Self-pay | Admitting: Family Medicine

## 2017-09-07 NOTE — Progress Notes (Deleted)
       Patient: Olivia Meyer Female    DOB: 1966-06-23   52 y.o.   MRN: [id number] Visit Date: 09/07/2017  Today's Provider: Lelon Huh, MD   No chief complaint on file.  Subjective:    HPI     Allergies  Allergen Reactions  . Ace Inhibitors     COUGH AND THROAT IRRITATION  . Morphine And Related     ITCHING AND SKIN BLOTCHES  . Prednisone     makers her 'crazy'     Current Outpatient Medications:  .  ALPRAZolam (XANAX) 0.5 MG tablet, TAKE ONE TABLET BY MOUTH ONCE TO TWICE DAILY AS NEEDED, Disp: 60 tablet, Rfl: 3 .  amLODipine (NORVASC) 5 MG tablet, TAKE ONE TABLET BY MOUTH ONCE DAILY, Disp: 30 tablet, Rfl: 4 .  B COMPLEX VITAMINS PO, Take 1 tablet by mouth daily., Disp: , Rfl:  .  Cholecalciferol (VITAMIN D-3) 1000 UNITS CAPS, Take 1 capsule by mouth daily., Disp: , Rfl:  .  estradiol (ESTRACE) 1 MG tablet, Take 1 tablet by mouth daily. Reported on 09/18/2015, Disp: , Rfl:  .  Flaxseed, Linseed, (FLAXSEED OIL) 1000 MG CAPS, Take 1 capsule by mouth daily., Disp: , Rfl:  .  fluticasone (FLONASE) 50 MCG/ACT nasal spray, Place 2 sprays into both nostrils daily., Disp: , Rfl:  .  hydrochlorothiazide (HYDRODIURIL) 25 MG tablet, TAKE ONE TABLET BY MOUTH ONCE DAILY, Disp: 30 tablet, Rfl: 5 .  meloxicam (MOBIC) 15 MG tablet, Take 1 tablet (15 mg total) by mouth daily. (Patient not taking: Reported on 08/31/2016), Disp: 30 tablet, Rfl: 2 .  Multiple Vitamins-Minerals (MULTIVITAMIN ADULT PO), Take 1 tablet by mouth daily., Disp: , Rfl:  .  Omega-3 Fatty Acids (FISH OIL) 1000 MG CAPS, Take 1 capsule by mouth daily., Disp: , Rfl:  .  vitamin E 1000 UNIT capsule, Take 1 capsule by mouth daily., Disp: , Rfl:   Review of Systems  Constitutional: Negative for appetite change, chills, fatigue and fever.  Respiratory: Negative for chest tightness and shortness of breath.   Cardiovascular: Negative for chest pain and palpitations.  Gastrointestinal: Negative for abdominal pain, nausea and  vomiting.  Neurological: Negative for dizziness and weakness.    Social History   Tobacco Use  . Smoking status: Former Smoker    Last attempt to quit: 06/29/1985    Years since quitting: 32.2  . Smokeless tobacco: Never Used  Substance Use Topics  . Alcohol use: Yes    Alcohol/week: 0.0 oz    Comment:   WINE ON WEEK ENDS ( MAYBE 4 GLASSES A WEEK )   Objective:   There were no vitals taken for this visit. There were no vitals filed for this visit.   Physical Exam      Assessment & Plan:           Lelon Huh, MD  [facility]

## 2017-09-15 NOTE — Telephone Encounter (Signed)
Referral neurology

## 2017-09-27 ENCOUNTER — Encounter: Payer: Self-pay | Admitting: Neurology

## 2017-09-27 ENCOUNTER — Ambulatory Visit (INDEPENDENT_AMBULATORY_CARE_PROVIDER_SITE_OTHER): Payer: Federal, State, Local not specified - PPO | Admitting: Neurology

## 2017-09-27 VITALS — BP 155/95 | HR 62 | Ht 63.0 in | Wt 171.5 lb

## 2017-09-27 DIAGNOSIS — G43709 Chronic migraine without aura, not intractable, without status migrainosus: Secondary | ICD-10-CM

## 2017-09-27 DIAGNOSIS — IMO0002 Reserved for concepts with insufficient information to code with codable children: Secondary | ICD-10-CM | POA: Insufficient documentation

## 2017-09-27 MED ORDER — SUMATRIPTAN SUCCINATE 50 MG PO TABS: 50.0000 mg | ORAL_TABLET | ORAL | 5 refills | Status: DC | PRN

## 2017-09-27 MED ORDER — VENLAFAXINE HCL ER 37.5 MG PO CP24: 75.0000 mg | ORAL_CAPSULE | Freq: Every day | ORAL | 11 refills | Status: DC
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
## 2017-09-28 ENCOUNTER — Other Ambulatory Visit: Payer: Self-pay | Admitting: *Deleted

## 2017-09-28 MED ORDER — SUMATRIPTAN SUCCINATE 50 MG PO TABS: ORAL_TABLET | ORAL | 5 refills | Status: DC

## 2017-10-01 ENCOUNTER — Other Ambulatory Visit: Payer: Self-pay | Admitting: *Deleted

## 2017-10-01 MED ORDER — AMLODIPINE BESYLATE 5 MG PO TABS
5.0000 mg | ORAL_TABLET | Freq: Every day | ORAL | 0 refills | Status: DC
Start: 2017-10-01 — End: 2017-11-15

## 2017-10-27 ENCOUNTER — Other Ambulatory Visit: Payer: Self-pay | Admitting: Family Medicine

## 2017-11-02 DIAGNOSIS — H5203 Hypermetropia, bilateral: Secondary | ICD-10-CM | POA: Diagnosis not present

## 2017-11-15 ENCOUNTER — Other Ambulatory Visit: Payer: Self-pay | Admitting: Family Medicine

## 2017-11-15 ENCOUNTER — Encounter: Payer: Self-pay | Admitting: Family Medicine

## 2017-11-15 MED ORDER — AMLODIPINE BESYLATE 5 MG PO TABS: 5.0000 mg | ORAL_TABLET | Freq: Every day | ORAL | 11 refills | Status: DC

## 2017-11-16 MED ORDER — ALPRAZOLAM 0.5 MG PO TABS: 0.5000 mg | ORAL_TABLET | Freq: Two times a day (BID) | ORAL | 0 refills | Status: DC | PRN

## 2017-12-16 DIAGNOSIS — K080 Exfoliation of teeth due to systemic causes: Secondary | ICD-10-CM | POA: Diagnosis not present

## 2017-12-22 ENCOUNTER — Other Ambulatory Visit: Payer: Self-pay | Admitting: Family Medicine

## 2017-12-22 NOTE — Telephone Encounter (Signed)
For Dr Fisher 

## 2017-12-23 MED ORDER — ALPRAZOLAM 0.5 MG PO TABS: 0.5000 mg | ORAL_TABLET | Freq: Two times a day (BID) | ORAL | 0 refills | Status: DC | PRN

## 2018-01-04 DIAGNOSIS — L718 Other rosacea: Secondary | ICD-10-CM | POA: Diagnosis not present

## 2018-01-04 DIAGNOSIS — L821 Other seborrheic keratosis: Secondary | ICD-10-CM | POA: Diagnosis not present

## 2018-01-04 DIAGNOSIS — L720 Epidermal cyst: Secondary | ICD-10-CM | POA: Diagnosis not present

## 2018-01-04 DIAGNOSIS — Z85828 Personal history of other malignant neoplasm of skin: Secondary | ICD-10-CM | POA: Diagnosis not present

## 2018-01-04 DIAGNOSIS — L723 Sebaceous cyst: Secondary | ICD-10-CM | POA: Diagnosis not present

## 2018-02-03 ENCOUNTER — Other Ambulatory Visit: Payer: Self-pay | Admitting: Family Medicine

## 2018-02-03 MED ORDER — ALPRAZOLAM 0.5 MG PO TABS: 0.5000 mg | ORAL_TABLET | Freq: Two times a day (BID) | ORAL | 0 refills | Status: DC | PRN

## 2018-02-13 ENCOUNTER — Other Ambulatory Visit: Payer: Self-pay | Admitting: Family Medicine

## 2018-02-14 MED ORDER — HYDROCHLOROTHIAZIDE 25 MG PO TABS
25.0000 mg | ORAL_TABLET | Freq: Every day | ORAL | 0 refills | Status: DC
Start: 2018-02-14 — End: 2018-03-09

## 2018-03-09 ENCOUNTER — Encounter: Payer: Self-pay | Admitting: Family Medicine

## 2018-03-09 ENCOUNTER — Ambulatory Visit (INDEPENDENT_AMBULATORY_CARE_PROVIDER_SITE_OTHER): Payer: Federal, State, Local not specified - PPO | Admitting: Family Medicine

## 2018-03-09 VITALS — BP 138/96 | HR 60 | Temp 97.7°F | Ht 63.0 in | Wt 166.0 lb

## 2018-03-09 DIAGNOSIS — E781 Pure hyperglyceridemia: Secondary | ICD-10-CM | POA: Diagnosis not present

## 2018-03-09 DIAGNOSIS — Z0000 Encounter for general adult medical examination without abnormal findings: Secondary | ICD-10-CM

## 2018-03-09 DIAGNOSIS — Z23 Encounter for immunization: Secondary | ICD-10-CM

## 2018-03-09 DIAGNOSIS — Z1211 Encounter for screening for malignant neoplasm of colon: Secondary | ICD-10-CM

## 2018-03-09 DIAGNOSIS — F419 Anxiety disorder, unspecified: Secondary | ICD-10-CM

## 2018-03-09 DIAGNOSIS — I10 Essential (primary) hypertension: Secondary | ICD-10-CM | POA: Diagnosis not present

## 2018-03-09 DIAGNOSIS — I8393 Asymptomatic varicose veins of bilateral lower extremities: Secondary | ICD-10-CM

## 2018-03-09 MED ORDER — ALPRAZOLAM 0.5 MG PO TABS: 0.5000 mg | ORAL_TABLET | Freq: Two times a day (BID) | ORAL | 0 refills | Status: DC | PRN

## 2018-03-09 MED ORDER — HYDROCHLOROTHIAZIDE 25 MG PO TABS: 25.0000 mg | ORAL_TABLET | Freq: Every day | ORAL | 1 refills | Status: DC

## 2018-03-09 NOTE — Progress Notes (Signed)
Patient: Olivia Meyer, Female    DOB: [birth year], 52 y.o.   MRN: [id number] Visit Date: 03/09/2018  Today's Provider: Lelon Huh, MD   Chief Complaint  Patient presents with  . Annual Exam  . Leg Pain   Subjective:    Annual physical exam Olivia Meyer is a 52 y.o. female who presents today for health maintenance and complete physical. She feels fairly well. She reports exercising regularly. She reports she is sleeping fairly well. She goes to [facility] gyn due to history of DES exposure.   -----------------------------------------------------------------  Leg Pain   Incident onset: Pain has been going on for several months. There was no injury mechanism. Pain location: Bilateral lower legs. Pertinent negatives include no inability to bear weight, loss of motion, loss of sensation, muscle weakness, numbness or tingling. She reports no foreign bodies present.  She is very bothered by varicose veins in her legs which she feels are contributing to pain and discomfort.    Review of Systems  Constitutional: Negative.   HENT: Negative.   Eyes: Negative.   Respiratory: Negative.   Cardiovascular: Negative.   Gastrointestinal: Negative.   Endocrine: Negative.   Genitourinary: Negative.   Musculoskeletal: Negative.   Skin: Negative.   Allergic/Immunologic: Negative.   Neurological: Negative.  Negative for tingling and numbness.  Hematological: Negative.   Psychiatric/Behavioral: Negative for agitation, behavioral problems, confusion, decreased concentration, dysphoric mood, hallucinations, self-injury, sleep disturbance and suicidal ideas. The patient is nervous/anxious. The patient is not hyperactive.     Social History      She  reports that she quit smoking about 32 years ago. She has never used smokeless tobacco. She reports that she drinks alcohol. She reports that she does not use drugs.       Social History   Socioeconomic History  . Marital status: Married   Spouse name: Not on file  . Number of children: 3  . Years of education: 14 years  . Highest education level: Associate degree: academic program  Occupational History  . Occupation: Horticulturist  Social Needs  . Financial resource strain: Not on file  . Food insecurity:    Worry: Not on file    Inability: Not on file  . Transportation needs:    Medical: Not on file    Non-medical: Not on file  Tobacco Use  . Smoking status: Former Smoker    Last attempt to quit: 06/29/1985    Years since quitting: 32.7  . Smokeless tobacco: Never Used  Substance and Sexual Activity  . Alcohol use: Yes    Alcohol/week: 0.0 standard drinks    Comment:   WINE ON WEEK ENDS ( MAYBE 3-4 GLASSES A WEEK )  . Drug use: No  . Sexual activity: Not on file  Lifestyle  . Physical activity:    Days per week: Not on file    Minutes per session: Not on file  . Stress: Not on file  Relationships  . Social connections:    Talks on phone: Not on file    Gets together: Not on file    Attends religious service: Not on file    Active member of club or organization: Not on file    Attends meetings of clubs or organizations: Not on file    Relationship status: Not on file  Other Topics Concern  . Not on file  Social History Narrative   Lives at home with husband.   Ambidextrous.   Caffeine  use: 1 cup per day.    Past Medical History:  Diagnosis Date  . Anxiety   . Arthritis    RIGHT HIP OA AND PAIN  . Cancer (HCC)    BASAL CANCER REMOVED LEFT UPPER LIP  . Depression   . History of chicken pox   . Hypertension   . Numbness    RIGHT LEG NUMBNESS SINCE NOV 2014 - HAS HAD ULTRASOUND - NO BLOOD CLOT      Patient Active Problem List   Diagnosis Date Noted  . Chronic migraine 09/27/2017  . Dizziness 07/27/2017  . Headache 07/27/2017  . 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 Hip osteoarthritis 02/05/2015  . History of basal cell cancer 02/05/2015  . Low back pain radiating to right leg  02/05/2015  . Menopausal symptom 02/05/2015  . Expected blood loss anemia 11/01/2013  . Overweight (BMI 25.0-29.9) 11/01/2013  . S/P total hip arthroplasty 10/31/2013  . History of endogenous hypertriglyceridemia 02/01/2009  . Family history of colonic polyps 09/18/2007  . Essential (primary) hypertension 08/22/2007  . Anxiety 06/29/2004  . Depressive disorder 06/29/2004  . History of herpes genitalis 06/29/2004    Past Surgical History:  Procedure Laterality Date  . 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 APPENDECTOMY  2001  . BASAL CELL CARCINOMA EXCISION  2008  . BREAST SURGERY  1998   BREAST AUGMENTATION  . [place]  . HIP ARTHROSCOPY Right 2013  . MOHS SURGERY  2014   LEFT UPPER LIP  . TOTAL HIP ARTHROPLASTY Right 10/31/2013   Procedure: RIGHT TOTAL HIP ARTHROPLASTY ANTERIOR APPROACH;  Surgeon: Mauri Pole, MD;  Location: [facility];  Service: Orthopedics;  Laterality: Right;  . TUBAL LIGATION  1999    Family History        Family Status  Relation Name Status  . Father  Deceased       history of Colorectal Adenoma  . Sister  Alive       history of colorectal Adenoma  . Mother  Alive  . MGM  Deceased  . MGF  Deceased  . PGF  Deceased  . Ethlyn Daniels  Deceased  . Cousin  Deceased        Her family history includes ALS in her cousin, paternal aunt, and paternal grandfather; Cervical cancer in her mother; Dementia in her mother; Hypertension in her mother; Irritable bowel syndrome in her mother; Kidney failure in her father; Melanoma in her mother; Non-Hodgkin's lymphoma in her father; Stomach cancer in her maternal grandfather; Vascular Disease in her maternal grandmother.      Allergies  Allergen Reactions  . Ace Inhibitors     COUGH AND THROAT IRRITATION  . Morphine And Related     ITCHING AND SKIN BLOTCHES  . Prednisone     makers her 'crazy'     Current Outpatient Medications:  .  ALPRAZolam (XANAX) 0.5 MG tablet, Take  1 tablet (0.5 mg total) by mouth 2 (two) times daily as needed for anxiety., Disp: 60 tablet, Rfl: 0 .  amLODipine (NORVASC) 5 MG tablet, Take 1 tablet (5 mg total) by mouth daily., Disp: 30 tablet, Rfl: 11 .  ASHWAGANDHA PO, Take 450 mg by mouth daily., Disp: , Rfl:  .  B COMPLEX VITAMINS PO, Take 1 tablet by mouth daily., Disp: , Rfl:  .  Cholecalciferol (VITAMIN D-3) 1000 UNITS CAPS, Take 1 capsule by mouth daily., Disp: , Rfl:  .  hydrochlorothiazide (HYDRODIURIL) 25 MG tablet, Take 1 tablet (25 mg total) by mouth daily., Disp: 30 tablet, Rfl: 0 .  Multiple Vitamins-Minerals (MULTIVITAMIN ADULT PO), Take 1 tablet by mouth daily., Disp: , Rfl:  .  Omega-3 Fatty Acids (FISH OIL) 1000 MG CAPS, Take 1 capsule by mouth daily., Disp: , Rfl:  .  Rhodiola rosea (RHODIOLA PO), Take 500 mg by mouth daily., Disp: , Rfl:  .  venlafaxine XR (EFFEXOR XR) 37.5 MG 24 hr capsule, Take 2 capsules (75 mg total) by mouth daily with breakfast., Disp: 60 capsule, Rfl: 11 .  vitamin E 1000 UNIT capsule, Take 1 capsule by mouth daily., Disp: , Rfl:  .  meloxicam (MOBIC) 15 MG tablet, Take 1 tablet (15 mg total) by mouth daily. (Patient not taking: Reported on 03/09/2018), Disp: 30 tablet, Rfl: 2 .  SUMAtriptan (IMITREX) 50 MG tablet, Take 1 tab at onset of migraine.  May repeat in 2 hrs, if needed.  Max dose: 2 tabs/day. This is a 30 day prescription. (Patient not taking: Reported on 03/09/2018), Disp: 12 tablet, Rfl: 5   Patient Care Team: [name], MD as PCP - General (Family Medicine) Marcial Pacas, MD as Consulting Physician (Neurology) Wilhemena Durie, MD   as Consulting Physician (Obstetrics and Gynecology)      Objective:   Vitals: BP (!) 138/96 (BP Location: Right Arm, Patient Position: Sitting, Cuff Size: Normal)   Pulse 60   Temp 97.7 F (36.5 C) (Oral)   Ht 5\' 3"  (1.6 m)   Wt 166 lb (75.3 kg)   SpO2 98%   BMI 29.41 kg/m    Vitals:   03/09/18 1022  BP: (!) 138/96  Pulse: 60    Temp: 97.7 F (36.5 C)  TempSrc: Oral  SpO2: 98%  Weight: 166 lb (75.3 kg)  Height: 5\' 3"  (1.6 m)     Physical Exam   General Appearance:    Alert, cooperative, no distress, appears stated age  Head:    Normocephalic, without obvious abnormality, atraumatic  Eyes:    PERRL, conjunctiva/corneas clear, EOM's intact, fundi    benign, both eyes  Ears:    Normal TM's and external ear canals, both ears  Nose:   Nares normal, septum midline, mucosa normal, no drainage    or sinus tenderness  Throat:   Lips, mucosa, and tongue normal; teeth and gums normal  Neck:   Supple, symmetrical, trachea midline, no adenopathy;    thyroid:  no enlargement/tenderness/nodules; no carotid   bruit or JVD  Back:     Symmetric, no curvature, ROM normal, no CVA tenderness  Lungs:     Clear to auscultation bilaterally, respirations unlabored  Chest Wall:    No tenderness or deformity   Heart:    Regular rate and rhythm, S1 and S2 normal, no murmur, rub   or gallop  Breast Exam:    deferred  Abdomen:     Soft, non-tender, bowel sounds active all four quadrants,    no masses, no organomegaly  Pelvic:    deferred  Extremities:   Extremities normal, atraumatic, no cyanosis. Trace edema. Bilateral varicosities.   Pulses:   2+ and symmetric all extremities  Skin:   Skin color, texture, turgor normal, no rashes or lesions  Lymph nodes:   Cervical, supraclavicular, and axillary nodes normal  Neurologic:   CNII-XII intact, normal strength, sensation and reflexes    throughout    Depression Screen PHQ 2/9 Scores 03/09/2018 09/18/2015  PHQ -  2 Score 2 0  PHQ- 9 Score 5 3      Assessment & Plan:     Routine Health Maintenance and Physical Exam  Exercise Activities and Dietary recommendations Goals   None     Immunization History  Administered Date(s) Administered  . Td 10/21/1988, 06/30/1999  . Tdap 09/18/2015    Health Maintenance  Topic Date Due  . HIV Screening  05/23/1981  .  COLONOSCOPY  05/23/2016  . MAMMOGRAM  09/17/2017  . INFLUENZA VACCINE  01/27/2018  . PAP SMEAR  07/09/2019  . TETANUS/TDAP  09/17/2025     Discussed health benefits of physical activity, and encouraged her to engage in regular exercise appropriate for her age and condition.    --------------------------------------------------------------------  1. Annual physical exam  - Comprehensive metabolic panel - Lipid panel - CBC  2. Essential (primary) hypertension Fairly well controlled. Continue current medications.  - hydrochlorothiazide (HYDRODIURIL) 25 MG tablet; Take 1 tablet (25 mg total) by mouth daily.  Dispense: 90 tablet; Refill: 1  3. Anxiety Doing well on current dose of - ALPRAZolam (XANAX) 0.5 MG tablet; Take 1 tablet (0.5 mg total) by mouth 2 (two) times daily as needed for anxiety.  Dispense: 60 tablet; Refill: 0  4. Hypertriglyceridemia  - Comprehensive metabolic panel - Lipid panel - CBC  5. Need for shingles vaccine  - Varicella-zoster vaccine IM (Shingrix)  6. Varicose veins of both lower extremities, unspecified whether complicated  - Ambulatory referral to Vascular Surgery  7. Colon cancer screening  - Cologuard    Lelon Huh, MD  [facility]

## 2018-03-14 DIAGNOSIS — K080 Exfoliation of teeth due to systemic causes: Secondary | ICD-10-CM | POA: Diagnosis not present

## 2018-03-14 DIAGNOSIS — E781 Pure hyperglyceridemia: Secondary | ICD-10-CM | POA: Diagnosis not present

## 2018-03-14 DIAGNOSIS — Z0000 Encounter for general adult medical examination without abnormal findings: Secondary | ICD-10-CM | POA: Diagnosis not present

## 2018-03-15 ENCOUNTER — Encounter: Payer: Self-pay | Admitting: Family Medicine

## 2018-03-15 DIAGNOSIS — R739 Hyperglycemia, unspecified: Secondary | ICD-10-CM | POA: Insufficient documentation

## 2018-03-15 LAB — CBC
Hematocrit: 41.8 % (ref 34.0–46.6)
Hemoglobin: 13.8 g/dL (ref 11.1–15.9)
MCH: 30.3 pg (ref 26.6–33.0)
MCHC: 33 g/dL (ref 31.5–35.7)
MCV: 92 fL (ref 79–97)
Platelets: 342 10*3/uL (ref 150–450)
RBC: 4.56 x10E6/uL (ref 3.77–5.28)
RDW: 12.7 % (ref 12.3–15.4)
WBC: 7.9 10*3/uL (ref 3.4–10.8)

## 2018-03-15 LAB — LIPID PANEL
CHOL/HDL RATIO: 4.1 ratio (ref 0.0–4.4)
Cholesterol, Total: 230 mg/dL — ABNORMAL HIGH (ref 100–199)
HDL: 56 mg/dL (ref >39)
LDL Calculated: 122 mg/dL — ABNORMAL HIGH (ref 0–99)
Triglycerides: 259 mg/dL — ABNORMAL HIGH (ref 0–149)
VLDL CHOLESTEROL CAL: 52 mg/dL — AB (ref 5–40)

## 2018-03-15 LAB — COMPREHENSIVE METABOLIC PANEL
A/G RATIO: 1.9 (ref 1.2–2.2)
ALT: 33 IU/L — AB (ref 0–32)
AST: 22 IU/L (ref 0–40)
Albumin: 4.7 g/dL (ref 3.5–5.5)
Alkaline Phosphatase: 78 IU/L (ref 39–117)
BUN / CREAT RATIO: 19 (ref 9–23)
BUN: 13 mg/dL (ref 6–24)
Bilirubin Total: 0.4 mg/dL (ref 0.0–1.2)
CALCIUM: 9.8 mg/dL (ref 8.7–10.2)
CO2: 25 mmol/L (ref 20–29)
Chloride: 97 mmol/L (ref 96–106)
Creatinine, Ser: 0.69 mg/dL (ref 0.57–1.00)
GFR, EST AFRICAN AMERICAN: 117 mL/min/{1.73_m2} (ref >59)
GFR, EST NON AFRICAN AMERICAN: 101 mL/min/{1.73_m2} (ref >59)
Globulin, Total: 2.5 g/dL (ref 1.5–4.5)
Glucose: 121 mg/dL — ABNORMAL HIGH (ref 65–99)
POTASSIUM: 4.1 mmol/L (ref 3.5–5.2)
Sodium: 141 mmol/L (ref 134–144)
TOTAL PROTEIN: 7.2 g/dL (ref 6.0–8.5)

## 2018-03-29 ENCOUNTER — Telehealth: Payer: Self-pay | Admitting: *Deleted

## 2018-03-29 ENCOUNTER — Encounter (INDEPENDENT_AMBULATORY_CARE_PROVIDER_SITE_OTHER): Payer: Federal, State, Local not specified - PPO | Admitting: Vascular Surgery

## 2018-03-29 ENCOUNTER — Ambulatory Visit: Payer: Federal, State, Local not specified - PPO | Admitting: Neurology

## 2018-03-29 NOTE — Telephone Encounter (Signed)
No showed follow up appointment. 

## 2018-03-30 ENCOUNTER — Encounter: Payer: Self-pay | Admitting: Neurology

## 2018-04-08 ENCOUNTER — Other Ambulatory Visit: Payer: Self-pay | Admitting: Family Medicine

## 2018-04-08 DIAGNOSIS — F419 Anxiety disorder, unspecified: Secondary | ICD-10-CM

## 2018-04-08 MED ORDER — ALPRAZOLAM 0.5 MG PO TABS: 0.5000 mg | ORAL_TABLET | Freq: Two times a day (BID) | ORAL | 3 refills | Status: DC | PRN

## 2018-05-11 ENCOUNTER — Ambulatory Visit (INDEPENDENT_AMBULATORY_CARE_PROVIDER_SITE_OTHER): Payer: Federal, State, Local not specified - PPO | Admitting: Family Medicine

## 2018-05-11 DIAGNOSIS — Z23 Encounter for immunization: Secondary | ICD-10-CM | POA: Diagnosis not present

## 2018-05-11 NOTE — Progress Notes (Signed)
Nurse Visit only. Administered 2nd dose of Shingrix vaccine.

## 2018-08-09 ENCOUNTER — Encounter: Payer: Self-pay | Admitting: Family Medicine

## 2018-08-09 ENCOUNTER — Ambulatory Visit (INDEPENDENT_AMBULATORY_CARE_PROVIDER_SITE_OTHER): Payer: Federal, State, Local not specified - PPO | Admitting: Family Medicine

## 2018-08-09 VITALS — BP 147/86 | HR 69 | Temp 97.9°F | Wt 170.0 lb

## 2018-08-09 DIAGNOSIS — R05 Cough: Secondary | ICD-10-CM | POA: Diagnosis not present

## 2018-08-09 DIAGNOSIS — R059 Cough, unspecified: Secondary | ICD-10-CM

## 2018-08-09 DIAGNOSIS — I10 Essential (primary) hypertension: Secondary | ICD-10-CM

## 2018-08-09 DIAGNOSIS — F419 Anxiety disorder, unspecified: Secondary | ICD-10-CM | POA: Diagnosis not present

## 2018-08-09 DIAGNOSIS — R7303 Prediabetes: Secondary | ICD-10-CM | POA: Diagnosis not present

## 2018-08-09 LAB — POCT GLYCOSYLATED HEMOGLOBIN (HGB A1C): Hemoglobin A1C: 6.1 % — AB (ref 4.0–5.6)

## 2018-08-09 MED ORDER — HYDROCHLOROTHIAZIDE 25 MG PO TABS: 25.0000 mg | ORAL_TABLET | Freq: Every day | ORAL | 1 refills | Status: DC

## 2018-08-09 MED ORDER — AZITHROMYCIN 250 MG PO TABS: ORAL_TABLET | ORAL | 0 refills | Status: AC

## 2018-08-09 MED ORDER — ALPRAZOLAM 0.5 MG PO TABS: 0.5000 mg | ORAL_TABLET | Freq: Two times a day (BID) | ORAL | 3 refills | Status: DC | PRN

## 2018-08-09 NOTE — Progress Notes (Signed)
Patient: Olivia Meyer Female    DOB: 1965/09/27   53 y.o.   MRN: [id number] Visit Date: 08/09/2018  Today's Provider: Lelon Huh, MD   Chief Complaint  Patient presents with  . Cough    Has been going on since November; worsening in the last week.   . Hypertension  . Anxiety   Subjective:    Cough  This is a recurrent problem. Episode onset: Pt reports she has had a cough since November when she had flu like symptoms for several days.  Pt states it is worse in the last week.   The problem has been gradually worsening. The cough is productive of sputum. Associated symptoms include ear pain (Left ear pain.), headaches, shortness of breath and wheezing. Pertinent negatives include no chest pain, chills, ear congestion, fever, heartburn, nasal congestion, postnasal drip, rhinorrhea, sore throat or weight loss. The treatment provided no relief.   Anxiety  Presents for follow-up visit. Symptoms include decreased concentration, depressed mood, excessive worry, nervous/anxious behavior and shortness of breath. Patient reports no chest pain, dizziness, insomnia, nausea, panic or suicidal ideas. The quality of sleep is good.  she takes alprazolam twice a day consistently. Has been trying to reduce alcohol due to elevated blood sugar at her CPE last year. Has eliminated liqueur but still drink a glass or two of wine most evenings.     Hypertension, follow-up:  BP Readings from Last 3 Encounters:  08/09/18 (!) 147/86  03/09/18 (!) 138/96  09/27/17 (!) 155/95    She was last seen for hypertension 5 months ago.  BP at that visit was 138/96. Management since that visit includes No changes. She reports excellent compliance with treatment. She is not having side effects.  She is exercising. She is adherent to low salt diet.   Outside blood pressures are being checked.  She states they have been running high. She is experiencing fatigue.  Patient denies chest pain, irregular heart  beat, lower extremity edema and syncope.   Cardiovascular risk factors include dyslipidemia and hypertension.  Use of agents associated with hypertension: none.     Weight trend: stable Wt Readings from Last 3 Encounters:  08/09/18 170 lb (77.1 kg)  03/09/18 166 lb (75.3 kg)  09/27/17 171 lb 8 oz (77.8 kg)    Current diet: in general, a "healthy" diet    ------------------------------------------------------------------------     Allergies  Allergen Reactions  . Ace Inhibitors     COUGH AND THROAT IRRITATION  . Morphine And Related     ITCHING AND SKIN BLOTCHES  . Prednisone     makers her 'crazy'     Current Outpatient Medications:  .  ALPRAZolam (XANAX) 0.5 MG tablet, Take 1 tablet (0.5 mg total) by mouth 2 (two) times daily as needed for anxiety., Disp: 60 tablet, Rfl: 3 .  amLODipine (NORVASC) 5 MG tablet, Take 1 tablet (5 mg total) by mouth daily., Disp: 30 tablet, Rfl: 11 .  ASHWAGANDHA PO, Take 450 mg by mouth daily., Disp: , Rfl:  .  B COMPLEX VITAMINS PO, Take 1 tablet by mouth daily., Disp: , Rfl:  .  Cholecalciferol (VITAMIN D-3) 1000 UNITS CAPS, Take 1 capsule by mouth daily., Disp: , Rfl:  .  hydrochlorothiazide (HYDRODIURIL) 25 MG tablet, Take 1 tablet (25 mg total) by mouth daily., Disp: 90 tablet, Rfl: 1 .  meloxicam (MOBIC) 15 MG tablet, Take 1 tablet (15 mg total) by mouth daily., Disp: 30 tablet, Rfl:  2 .  Multiple Vitamins-Minerals (MULTIVITAMIN ADULT PO), Take 1 tablet by mouth daily., Disp: , Rfl:  .  Omega-3 Fatty Acids (FISH OIL) 1000 MG CAPS, Take 1 capsule by mouth daily., Disp: , Rfl:  .  Rhodiola rosea (RHODIOLA PO), Take 500 mg by mouth daily., Disp: , Rfl:  .  venlafaxine XR (EFFEXOR XR) 37.5 MG 24 hr capsule, Take 2 capsules (75 mg total) by mouth daily with breakfast., Disp: 60 capsule, Rfl: 11 .  vitamin E 1000 UNIT capsule, Take 1 capsule by mouth daily., Disp: , Rfl:  .  SUMAtriptan (IMITREX) 50 MG tablet, Take 1 tab at onset of migraine.   May repeat in 2 hrs, if needed.  Max dose: 2 tabs/day. This is a 30 day prescription. (Patient not taking: Reported on 03/09/2018), Disp: 12 tablet, Rfl: 5  Review of Systems  Constitutional: Positive for fatigue. Negative for activity change, appetite change, chills, diaphoresis, fever, unexpected weight change and weight loss.  HENT: Positive for congestion, ear pain (Left ear pain.) and voice change. Negative for ear discharge, hearing loss, postnasal drip, rhinorrhea, sinus pressure, sinus pain, sneezing, sore throat, tinnitus and trouble swallowing.   Eyes: Negative.   Respiratory: Positive for cough, chest tightness, shortness of breath and wheezing. Negative for apnea, choking and stridor.   Cardiovascular: Negative for chest pain.  Gastrointestinal: Negative.  Negative for heartburn and nausea.  Musculoskeletal: Negative for neck pain and neck stiffness.  Neurological: Positive for headaches. Negative for dizziness and light-headedness.  Hematological: Negative for adenopathy.  Psychiatric/Behavioral: Positive for decreased concentration. Negative for suicidal ideas. The patient is nervous/anxious. The patient does not have insomnia.     Social History   Tobacco Use  . Smoking status: Former Smoker    Last attempt to quit: 06/29/1985    Years since quitting: 33.1  . Smokeless tobacco: Never Used  Substance Use Topics  . Alcohol use: Yes    Alcohol/week: 0.0 standard drinks    Comment:   WINE ON WEEK ENDS ( MAYBE 3-4 GLASSES A WEEK )      Objective:   BP (!) 147/86 (BP Location: Right Arm, Patient Position: Sitting, Cuff Size: Normal)   Pulse 69   Temp 97.9 F (36.6 C) (Oral)   Wt 170 lb (77.1 kg)   SpO2 98%   BMI 30.11 kg/m  Vitals:   08/09/18 1041  BP: (!) 147/86  Pulse: 69  Temp: 97.9 F (36.6 C)  TempSrc: Oral  SpO2: 98%  Weight: 170 lb (77.1 kg)     Physical Exam  General Appearance:    Alert, cooperative, no distress  HENT:   ENT exam normal, no neck  nodes or sinus tenderness  Eyes:    PERRL, conjunctiva/corneas clear, EOM's intact       Lungs:     Clear to auscultation bilaterally, respirations unlabored  Heart:    Regular rate and rhythm  Neurologic:   Awake, alert, oriented x 3. No apparent focal neurological           defect.       Results for orders placed or performed in visit on 08/09/18  POCT glycosylated hemoglobin (Hb A1C)  Result Value Ref Range   Hemoglobin A1C 6.1 (A) 4.0 - 5.6 %       Assessment & Plan    1. Cough Now improving with normal exam. Likely viral and expect continued improved. Given printed prescription azithromycin to fill only if not continued to steadily improve.  2. Pre-diabetes Counseled regarding prudent diet and regular exercise. Reduce alcohol, elimiate sweets and enriched cards. Check a1c at cpe in the fall.   3. Anxiety Stable refill- ALPRAZolam (XANAX) 0.5 MG tablet; Take 1 tablet (0.5 mg total) by mouth 2 (two) times daily as needed for anxiety.  Dispense: 60 tablet; Refill: 3  4. Essential (primary) hypertension Continue current medications.  refill- hydrochlorothiazide (HYDRODIURIL) 25 MG tablet; Take 1 tablet (25 mg total) by mouth daily.  Dispense: 90 tablet; Refill: 1  Future Appointments  Date Time Provider [facility]  03/13/2019  9:00 AM Fisher, Kirstie Peri, MD [facility] None       Lelon Huh, MD  [facility]

## 2018-08-09 NOTE — Patient Instructions (Signed)
.   Please review the attached list of medications and notify my office if there are any errors.   . Please bring all of your medications to every appointment so we can make sure that our medication list is the same as yours.   

## 2018-08-23 DIAGNOSIS — K080 Exfoliation of teeth due to systemic causes: Secondary | ICD-10-CM | POA: Diagnosis not present

## 2018-08-30 ENCOUNTER — Encounter: Payer: Self-pay | Admitting: Family Medicine

## 2018-10-03 ENCOUNTER — Telehealth: Payer: Self-pay | Admitting: Neurology

## 2018-10-05 MED ORDER — VENLAFAXINE HCL ER 37.5 MG PO CP24: 75.0000 mg | ORAL_CAPSULE | Freq: Every day | ORAL | 5 refills | Status: DC

## 2018-10-05 NOTE — Addendum Note (Signed)
Addended by: Brandon Melnick on: 10/05/2018 03:20 PM   Modules accepted: Orders

## 2018-10-05 NOTE — Telephone Encounter (Signed)
Pt has appt set

## 2018-10-05 NOTE — Telephone Encounter (Signed)
appt set for 12-06-18 at 1300. Refilled effexor for 6 months.

## 2018-12-06 ENCOUNTER — Encounter: Payer: Self-pay | Admitting: Neurology

## 2018-12-06 ENCOUNTER — Other Ambulatory Visit: Payer: Self-pay

## 2018-12-06 ENCOUNTER — Ambulatory Visit (INDEPENDENT_AMBULATORY_CARE_PROVIDER_SITE_OTHER): Payer: Federal, State, Local not specified - PPO | Admitting: Neurology

## 2018-12-06 DIAGNOSIS — G43709 Chronic migraine without aura, not intractable, without status migrainosus: Secondary | ICD-10-CM

## 2018-12-06 DIAGNOSIS — IMO0002 Reserved for concepts with insufficient information to code with codable children: Secondary | ICD-10-CM

## 2018-12-06 MED ORDER — VENLAFAXINE HCL ER 75 MG PO CP24: 75.0000 mg | ORAL_CAPSULE | Freq: Every day | ORAL | 4 refills | Status: DC

## 2018-12-06 MED ORDER — SUMATRIPTAN SUCCINATE 50 MG PO TABS: ORAL_TABLET | ORAL | 11 refills | Status: DC
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
## 2018-12-13 ENCOUNTER — Encounter: Payer: Self-pay | Admitting: Family Medicine

## 2018-12-13 ENCOUNTER — Other Ambulatory Visit: Payer: Self-pay | Admitting: Family Medicine

## 2018-12-13 DIAGNOSIS — F419 Anxiety disorder, unspecified: Secondary | ICD-10-CM

## 2018-12-13 NOTE — Telephone Encounter (Signed)
Already done

## 2019-01-06 ENCOUNTER — Encounter: Payer: Self-pay | Admitting: Family Medicine

## 2019-01-06 MED ORDER — AMLODIPINE BESYLATE 5 MG PO TABS: 5.0000 mg | ORAL_TABLET | Freq: Every day | ORAL | 11 refills | Status: DC

## 2019-02-25 DIAGNOSIS — S92515A Nondisplaced fracture of proximal phalanx of left lesser toe(s), initial encounter for closed fracture: Secondary | ICD-10-CM | POA: Diagnosis not present

## 2019-03-13 ENCOUNTER — Encounter: Payer: Federal, State, Local not specified - PPO | Admitting: Family Medicine

## 2019-03-13 NOTE — Progress Notes (Deleted)
Patient: Olivia Meyer, Female    DOB: [birth year], 53 y.o.   MRN: [id number] Visit Date: 03/13/2019  Today's Provider: Lelon Huh, MD   No chief complaint on file.  Subjective:    Annual physical exam LUCILLIA RAGASA is a 53 y.o. female who presents today for health maintenance and complete physical. She feels {DESC; WELL/FAIRLY WELL/POORLY:18703}. She reports exercising ***. She reports she is sleeping {DESC; WELL/FAIRLY WELL/POORLY:18703}.  ----------------------------------------------------------------- Last reported Tdap-09/18/15 Zoster- completed series on 05/11/18 Pap- 06/1016 HPV negative Cologuard- Due  Mammogram- 07/27/16 Review of Systems  Social History She  reports that she quit smoking about 33 years ago. She has never used smokeless tobacco. She reports current alcohol use. She reports that she does not use drugs. Social History   Socioeconomic History  . Marital status: Married    Spouse name: Not on file  . Number of children: 3  . Years of education: 14 years  . Highest education level: Associate degree: academic program  Occupational History  . Occupation: Horticulturist  Social Needs  . Financial resource strain: Not on file  . Food insecurity    Worry: Not on file    Inability: Not on file  . Transportation needs    Medical: Not on file    Non-medical: Not on file  Tobacco Use  . Smoking status: Former Smoker    Quit date: 06/29/1985    Years since quitting: 33.7  . Smokeless tobacco: Never Used  Substance and Sexual Activity  . Alcohol use: Yes    Alcohol/week: 0.0 standard drinks    Comment:   WINE ON WEEK ENDS ( MAYBE 3-4 GLASSES A WEEK )  . Drug use: No  . Sexual activity: Not on file  Lifestyle  . Physical activity    Days per week: Not on file    Minutes per session: Not on file  . Stress: Not on file  Relationships  . Social Herbalist on phone: Not on file    Gets together: Not on file    Attends religious  service: Not on file    Active member of club or organization: Not on file    Attends meetings of clubs or organizations: Not on file    Relationship status: Not on file  Other Topics Concern  . Not on file  Social History Narrative   Lives at home with husband.   Ambidextrous.   Caffeine use: 1 cup per day.    Patient Active Problem List   Diagnosis Date Noted  . Hyperglycemia 03/15/2018  . Chronic migraine 09/27/2017  . 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 Hip osteoarthritis 02/05/2015  . History of basal cell cancer 02/05/2015  . Low back pain radiating to right leg 02/05/2015  . Menopausal symptom 02/05/2015  . Overweight (BMI 25.0-29.9) 11/01/2013  . S/P total hip arthroplasty 10/31/2013  . Hypertriglyceridemia 02/01/2009  . Family history of colonic polyps 09/18/2007  . Essential (primary) hypertension 08/22/2007  . Anxiety 06/29/2004  . Depressive disorder 06/29/2004  . History of herpes genitalis 06/29/2004    Past Surgical History:  Procedure Laterality Date  . 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 APPENDECTOMY  2001  . BASAL CELL CARCINOMA EXCISION  2008  . BREAST SURGERY  1998   BREAST AUGMENTATION  . [place]  . HIP ARTHROSCOPY Right 2013  . [place]  2014   LEFT  UPPER LIP  . TOTAL HIP ARTHROPLASTY Right 10/31/2013   Procedure: RIGHT TOTAL HIP ARTHROPLASTY ANTERIOR APPROACH;  Surgeon: Mauri Pole, MD;  Location: [facility];  Service: Orthopedics;  Laterality: Right;  . TUBAL LIGATION  1999    Family History  Family Status  Relation Name Status  . Father  Deceased       history of Colorectal Adenoma  . Sister  Alive       history of colorectal Adenoma  . Mother  Alive  . MGM  Deceased  . MGF  Deceased  . PGF  Deceased  . Ethlyn Daniels  Deceased  . Cousin  Deceased   Her family history includes ALS in her cousin, paternal aunt, and paternal grandfather; Cervical cancer in her mother; Dementia in her  mother; Hypertension in her mother; Irritable bowel syndrome in her mother; Kidney failure in her father; Melanoma in her mother; Non-Hodgkin's lymphoma in her father; Stomach cancer in her maternal grandfather; Vascular Disease in her maternal grandmother.     Allergies  Allergen Reactions  . Ace Inhibitors     COUGH AND THROAT IRRITATION  . Morphine And Related     ITCHING AND SKIN BLOTCHES  . Prednisone     makers her 'crazy'    Previous Medications   ALPRAZOLAM (XANAX) 0.5 MG TABLET    Take 1 tablet by mouth twice daily as needed for anxiety   AMLODIPINE (NORVASC) 5 MG TABLET    Take 1 tablet (5 mg total) by mouth daily.   ASHWAGANDHA PO    Take 450 mg by mouth daily.   B COMPLEX VITAMINS PO    Take 1 tablet by mouth daily.   CHOLECALCIFEROL (VITAMIN D-3) 1000 UNITS CAPS    Take 1 capsule by mouth daily.   HYDROCHLOROTHIAZIDE (HYDRODIURIL) 25 MG TABLET    Take 1 tablet (25 mg total) by mouth daily.   MELOXICAM (MOBIC) 15 MG TABLET    Take 1 tablet (15 mg total) by mouth daily.   MULTIPLE VITAMINS-MINERALS (MULTIVITAMIN ADULT PO)    Take 1 tablet by mouth daily.   OMEGA-3 FATTY ACIDS (FISH OIL) 1000 MG CAPS    Take 1 capsule by mouth daily.   RHODIOLA ROSEA (RHODIOLA PO)    Take 500 mg by mouth daily.   SUMATRIPTAN (IMITREX) 50 MG TABLET    Take 1 tab at onset of migraine.  May repeat in 2 hrs, if needed.  Max dose: 2 tabs/day. This is a 30 day prescription.   VENLAFAXINE XR (EFFEXOR-XR) 75 MG 24 HR CAPSULE    Take 1 capsule (75 mg total) by mouth daily with breakfast.   VITAMIN E 1000 UNIT CAPSULE    Take 1 capsule by mouth daily.    Patient Care Team: [name], MD as PCP - General (Family Medicine) Marcial Pacas, MD as Consulting Physician (Neurology) Wilhemena Durie, MD   as Consulting Physician (Obstetrics and Gynecology)      Objective:   Vitals: There were no vitals taken for this visit.   Physical Exam   Depression Screen PHQ 2/9 Scores 03/09/2018  09/18/2015  PHQ - 2 Score 2 0  PHQ- 9 Score 5 3      Assessment & Plan:     Routine Health Maintenance and Physical Exam  Exercise Activities and Dietary recommendations Goals   None     Immunization History  Administered Date(s) Administered  . Td 10/21/1988, 06/30/1999  . Tdap 09/18/2015  . Zoster  Recombinat (Shingrix) 03/09/2018, 05/11/2018    Health Maintenance  Topic Date Due  . HIV Screening  05/23/1981  . COLONOSCOPY  05/23/2016  . MAMMOGRAM  09/17/2017  . INFLUENZA VACCINE  01/28/2019  . PAP SMEAR-Modifier  07/09/2019  . TETANUS/TDAP  09/17/2025     Discussed health benefits of physical activity, and encouraged her to engage in regular exercise appropriate for her age and condition.    -------------------------------------------------------------------- Clint Bolder as a scribe for Lelon Huh, MD.,have documented all relevant documentation on the behalf of Lelon Huh, MD,as directed by  Lelon Huh, MD while in the presence of Lelon Huh, MD.

## 2019-04-17 ENCOUNTER — Encounter: Payer: Self-pay | Admitting: Family Medicine

## 2019-04-18 ENCOUNTER — Telehealth: Payer: Self-pay | Admitting: Family Medicine

## 2019-04-18 DIAGNOSIS — F419 Anxiety disorder, unspecified: Secondary | ICD-10-CM

## 2019-04-18 MED ORDER — ALPRAZOLAM 0.5 MG PO TABS: 0.5000 mg | ORAL_TABLET | Freq: Two times a day (BID) | ORAL | 2 refills | Status: DC | PRN

## 2019-04-18 NOTE — Telephone Encounter (Signed)
Follow up scheduled for 04/24/2019

## 2019-04-18 NOTE — Telephone Encounter (Signed)
Refill send to [facility]. Please schedule appointment as requested in Mychart message below   Non-Urgent Medical Question  Jules Schick, [place] routed conversation to You 1 hour ago (8:13 AM)    Othella Boyer D  You 15 hours ago (5:37 PM)     I missed my last appointment because I started a good job July 20 at [employer] and was still in probationary period and couldn't take the time off. I would like to schedule an appointment for any day 3pm or after. I also need a refill on my alprazolam .5mg  I'm out and need refill.   Thank you   Thank you

## 2019-04-24 ENCOUNTER — Encounter: Payer: Self-pay | Admitting: Family Medicine

## 2019-04-24 ENCOUNTER — Other Ambulatory Visit: Payer: Self-pay

## 2019-04-24 ENCOUNTER — Ambulatory Visit (INDEPENDENT_AMBULATORY_CARE_PROVIDER_SITE_OTHER): Payer: Federal, State, Local not specified - PPO | Admitting: Family Medicine

## 2019-04-24 VITALS — BP 152/90 | HR 73 | Temp 97.5°F | Resp 16 | Wt 176.0 lb

## 2019-04-24 DIAGNOSIS — R7303 Prediabetes: Secondary | ICD-10-CM

## 2019-04-24 DIAGNOSIS — I10 Essential (primary) hypertension: Secondary | ICD-10-CM | POA: Diagnosis not present

## 2019-04-24 LAB — POCT GLYCOSYLATED HEMOGLOBIN (HGB A1C)
Est. average glucose Bld gHb Est-mCnc: 131
Hemoglobin A1C: 6.2 % — AB (ref 4.0–5.6)

## 2019-04-24 MED ORDER — VALSARTAN 80 MG PO TABS: 80.0000 mg | ORAL_TABLET | Freq: Every day | ORAL | 3 refills | Status: DC

## 2019-04-24 NOTE — Progress Notes (Signed)
Patient: Olivia Meyer Female    DOB: [birth year]   53 y.o.   MRN: [id number] Visit Date: 04/24/2019  Today's Provider: Lelon Huh, MD   Chief Complaint  Patient presents with  . Hypertension  . Anxiety  . Hyperglycemia   Subjective:     HPI  Follow up for Anxiety:  The patient was last seen for this 8 months ago. Changes made at last visit include none.  She reports good compliance with treatment. She feels that condition is Improved. She is not having side effects.   ------------------------------------------------------------------------------------  Hypertension, follow-up:  BP Readings from Last 3 Encounters:  04/24/19 (!) 152/90  08/09/18 (!) 147/86  03/09/18 (!) 138/96    She was last seen for hypertension 8 months ago.  BP at that visit was 147/86. Management since that visit includes no changes. She reports fair compliance with treatment. Does not take HCTZ daily as prescribed. She is not having side effects.  She is exercising. She is adherent to low salt diet.   Outside blood pressures are not being checked. She is experiencing lower extremity edema.  Patient denies chest pain, chest pressure/discomfort, claudication, dyspnea, exertional chest pressure/discomfort, fatigue, irregular heart beat, near-syncope, orthopnea, palpitations, paroxysmal nocturnal dyspnea, syncope and tachypnea.   Cardiovascular risk factors include hypertension.  Use of agents associated with hypertension: none.     Weight trend: fluctuating a bit Wt Readings from Last 3 Encounters:  04/24/19 176 lb (79.8 kg)  08/09/18 170 lb (77.1 kg)  03/09/18 166 lb (75.3 kg)    Current diet: well balanced  ------------------------------------------------------------------------  Prediabetes, Follow-up:   Lab Results  Component Value Date   HGBA1C 6.1 (A) 08/09/2018   GLUCOSE 121 (H) 03/14/2018   GLUCOSE 109 (H) 08/31/2016   GLUCOSE 101 (H) 09/18/2015    Last seen  for for this 8 months ago.  Management since that visit includes counseling patient regarding prudent diet and regular exercise. Patient was also advised to reduce alcohol, elimiate sweets and enriched cards.. Current symptoms include none and have been stable.  Weight trend: fluctuating a bit Prior visit with dietician: no Current diet: well balanced Current exercise: work is exertional  Pertinent Labs:    Component Value Date/Time   CHOL 230 (H) 03/14/2018 0841   TRIG 259 (H) 03/14/2018 0841   CHOLHDL 4.1 03/14/2018 0841   CREATININE 0.69 03/14/2018 0841    Wt Readings from Last 3 Encounters:  04/24/19 176 lb (79.8 kg)  08/09/18 170 lb (77.1 kg)  03/09/18 166 lb (75.3 kg)    Allergies  Allergen Reactions  . Ace Inhibitors     COUGH AND THROAT IRRITATION  . Morphine And Related     ITCHING AND SKIN BLOTCHES  . Prednisone     makers her 'crazy'     Current Outpatient Medications:  .  ALPRAZolam (XANAX) 0.5 MG tablet, Take 1 tablet (0.5 mg total) by mouth 2 (two) times daily as needed. for anxiety, Disp: 60 tablet, Rfl: 2 .  amLODipine (NORVASC) 5 MG tablet, Take 1 tablet (5 mg total) by mouth daily., Disp: 30 tablet, Rfl: 11 .  ASHWAGANDHA PO, Take 450 mg by mouth daily., Disp: , Rfl:  .  B COMPLEX VITAMINS PO, Take 1 tablet by mouth daily., Disp: , Rfl:  .  Cholecalciferol (VITAMIN D-3) 1000 UNITS CAPS, Take 1 capsule by mouth daily., Disp: , Rfl:  .  hydrochlorothiazide (HYDRODIURIL) 25 MG tablet, Take 1 tablet (25 mg total)  by mouth daily., Disp: 90 tablet, Rfl: 1 .  Multiple Vitamins-Minerals (MULTIVITAMIN ADULT PO), Take 1 tablet by mouth daily., Disp: , Rfl:  .  Omega-3 Fatty Acids (FISH OIL) 1000 MG CAPS, Take 1 capsule by mouth daily., Disp: , Rfl:  .  Rhodiola rosea (RHODIOLA PO), Take 500 mg by mouth daily., Disp: , Rfl:  .  SUMAtriptan (IMITREX) 50 MG tablet, Take 1 tab at onset of migraine.  May repeat in 2 hrs, if needed.  Max dose: 2 tabs/day. This is a 30  day prescription., Disp: 12 tablet, Rfl: 11 .  venlafaxine XR (EFFEXOR-XR) 75 MG 24 hr capsule, Take 1 capsule (75 mg total) by mouth daily with breakfast., Disp: 90 capsule, Rfl: 4 .  vitamin E 1000 UNIT capsule, Take 1 capsule by mouth daily., Disp: , Rfl:   Review of Systems  Constitutional: Negative for appetite change, chills, fatigue and fever.  Respiratory: Negative for chest tightness and shortness of breath.   Cardiovascular: Positive for leg swelling (occasional swelling of right leg). Negative for chest pain and palpitations.  Gastrointestinal: Negative for abdominal pain, nausea and vomiting.  Neurological: Positive for numbness (in fingers of right hand). Negative for dizziness and weakness.    Social History   Tobacco Use  . Smoking status: Former Smoker    Quit date: 06/29/1985    Years since quitting: 33.8  . Smokeless tobacco: Never Used  Substance Use Topics  . Alcohol use: Yes    Alcohol/week: 0.0 standard drinks    Comment:   WINE ON WEEK ENDS ( MAYBE 3-4 GLASSES A WEEK )      Objective:   BP (!) 152/90 (BP Location: Right Arm, Cuff Size: Normal)   Pulse 73   Temp (!) 97.5 F (36.4 C) (Temporal)   Resp 16   Wt 176 lb (79.8 kg)   SpO2 98% Comment: room air  BMI 31.18 kg/m  Vitals:   04/24/19 1502 04/24/19 1504  BP: (!) 146/86 (!) 152/90  Pulse: 73   Resp: 16   Temp: (!) 97.5 F (36.4 C)   TempSrc: Temporal   SpO2: 98%   Weight: 176 lb (79.8 kg)   Body mass index is 31.18 kg/m.   Physical Exam   General Appearance:    Overweight female in no acute distress  Eyes:    PERRL, conjunctiva/corneas clear, EOM's intact       Lungs:     Clear to auscultation bilaterally, respirations unlabored  Heart:    Normal heart rate. Normal rhythm. No murmurs, rubs, or gallops.   MS:   All extremities are intact.   Neurologic:   Awake, alert, oriented x 3. No apparent focal neurological           defect.        Results for orders placed or performed in visit  on 04/24/19  POCT HgB A1C  Result Value Ref Range   Hemoglobin A1C 6.2 (A) 4.0 - 5.6 %   Est. average glucose Bld gHb Est-mCnc 131        Assessment & Plan     1. Pre-diabetes Stable. Continue current diet and encourage regular exercise.   2. Essential (primary) hypertension Start - valsartan (DIOVAN) 80 MG tablet; Take 1 tablet (80 mg total) by mouth daily.  Dispense: 30 tablet; Refill: 3 Future Appointments  Date Time Provider [place]  05/29/2019  3:20 PM [name], MD [facility] None   Check lipids and renal prior to visit  in 1 month.   The entirety of the information documented in the History of Present Illness, Review of Systems and Physical Exam were personally obtained by me. Portions of this information were initially documented by Meyer Cory, CMA and reviewed by me for thoroughness and accuracy.      Lelon Huh, MD  [facility]

## 2019-04-24 NOTE — Patient Instructions (Addendum)
.   It is especially important to get the annual flu vaccine this year. If you haven't had it already, please go to your pharmacy or call the office as soon as possible to schedule you flu shot.   Stop taking any potassium supplements since you are now taking valsartan, which increases potassium levels.   . Please go to the lab draw station in [address] on the second floor of [facility]  when you are fasting for 8 hours. Normal hours are 8:00am to 12:30pm and 1:30pm to 4:00pm Monday through Friday. You can go anytime within a week or your next appointment

## 2019-04-30 DIAGNOSIS — R7303 Prediabetes: Secondary | ICD-10-CM | POA: Insufficient documentation

## 2019-05-18 ENCOUNTER — Telehealth: Payer: Self-pay | Admitting: Family Medicine

## 2019-05-18 DIAGNOSIS — I10 Essential (primary) hypertension: Secondary | ICD-10-CM

## 2019-05-18 DIAGNOSIS — E781 Pure hyperglyceridemia: Secondary | ICD-10-CM

## 2019-05-18 NOTE — Telephone Encounter (Signed)
Please advise patient it is time to check lipids and renal before her visit on the 30th. Please print order and leave at lab. She needs to be fasting

## 2019-05-19 NOTE — Telephone Encounter (Signed)
Tried calling patient. Left message to call back. OK for [facility] triage nure to advise patient.

## 2019-05-24 NOTE — Telephone Encounter (Signed)
LMTCB, ok for [facility] to advise patient.

## 2019-05-29 ENCOUNTER — Ambulatory Visit: Payer: Federal, State, Local not specified - PPO | Admitting: Family Medicine

## 2019-05-29 ENCOUNTER — Encounter: Payer: Self-pay | Admitting: Family Medicine

## 2019-05-29 ENCOUNTER — Other Ambulatory Visit: Payer: Self-pay

## 2019-05-29 VITALS — BP 150/100 | Temp 96.9°F | Wt 177.8 lb

## 2019-05-29 DIAGNOSIS — E781 Pure hyperglyceridemia: Secondary | ICD-10-CM | POA: Diagnosis not present

## 2019-05-29 DIAGNOSIS — I10 Essential (primary) hypertension: Secondary | ICD-10-CM

## 2019-05-29 NOTE — Progress Notes (Signed)
Patient: Olivia Meyer Female    DOB: 06-25-1966   53 y.o.   MRN: [id number] Visit Date: 05/29/2019  Today's Provider: Lelon Huh, MD   Chief Complaint  Patient presents with  . Hypertension   Subjective:     HPI  Hypertension, follow-up:  BP Readings from Last 3 Encounters:  05/29/19 (!) 178/117  04/24/19 (!) 152/90  08/09/18 (!) 147/86    She was last seen for hypertension 1 months ago.  BP at that visit was 152/90. Management since that visit includes starting Valsartan 80mg  daily. She reports good compliance with treatment. She is not having side effects.  She is exercising. She is adherent to low salt diet.   Outside blood pressures are being checked at home. She is experiencing none.  Patient denies chest pain, chest pressure/discomfort, irregular heart beat and palpitations.   Cardiovascular risk factors include hypertension.  Use of agents associated with hypertension: none.     Weight trend: stable Wt Readings from Last 3 Encounters:  05/29/19 177 lb 12.8 oz (80.6 kg)  04/24/19 176 lb (79.8 kg)  08/09/18 170 lb (77.1 kg)     ------------------------------------------------------------------------  Allergies  Allergen Reactions  . Ace Inhibitors     COUGH AND THROAT IRRITATION  . Morphine And Related     ITCHING AND SKIN BLOTCHES  . Prednisone     makers her 'crazy'     Current Outpatient Medications:  .  ALPRAZolam (XANAX) 0.5 MG tablet, Take 1 tablet (0.5 mg total) by mouth 2 (two) times daily as needed. for anxiety, Disp: 60 tablet, Rfl: 2 .  ASHWAGANDHA PO, Take 450 mg by mouth daily., Disp: , Rfl:  .  B COMPLEX VITAMINS PO, Take 1 tablet by mouth daily., Disp: , Rfl:  .  Cholecalciferol (VITAMIN D-3) 1000 UNITS CAPS, Take 1 capsule by mouth daily., Disp: , Rfl:  .  Multiple Vitamins-Minerals (MULTIVITAMIN ADULT PO), Take 1 tablet by mouth daily., Disp: , Rfl:  .  Omega-3 Fatty Acids (FISH OIL) 1000 MG CAPS, Take 1 capsule by  mouth daily., Disp: , Rfl:  .  Rhodiola rosea (RHODIOLA PO), Take 500 mg by mouth daily., Disp: , Rfl:  .  SUMAtriptan (IMITREX) 50 MG tablet, Take 1 tab at onset of migraine.  May repeat in 2 hrs, if needed.  Max dose: 2 tabs/day. This is a 30 day prescription., Disp: 12 tablet, Rfl: 11 .  valsartan (DIOVAN) 80 MG tablet, Take 1 tablet (80 mg total) by mouth daily., Disp: 30 tablet, Rfl: 3 .  venlafaxine XR (EFFEXOR-XR) 75 MG 24 hr capsule, Take 1 capsule (75 mg total) by mouth daily with breakfast., Disp: 90 capsule, Rfl: 4 .  vitamin E 1000 UNIT capsule, Take 1 capsule by mouth daily., Disp: , Rfl:   Review of Systems  Constitutional: Negative for appetite change, chills, fatigue and fever.  Respiratory: Negative for chest tightness and shortness of breath.   Cardiovascular: Negative for chest pain and palpitations.  Gastrointestinal: Negative for abdominal pain, nausea and vomiting.  Neurological: Negative for dizziness and weakness.    Social History   Tobacco Use  . Smoking status: Former Smoker    Quit date: 06/29/1985    Years since quitting: 33.9  . Smokeless tobacco: Never Used  Substance Use Topics  . Alcohol use: Yes    Alcohol/week: 0.0 standard drinks    Comment:   WINE ON WEEK ENDS ( MAYBE 3-4 GLASSES A WEEK )  Objective:   BP (!) 178/117 (BP Location: Left Arm, Patient Position: Sitting, Cuff Size: Normal)   Temp (!) 96.9 F (36.1 C) (Temporal)   Wt 177 lb 12.8 oz (80.6 kg)   BMI 31.50 kg/m  Vitals:   05/29/19 1516  BP: (!) 178/117  Temp: (!) 96.9 F (36.1 C)  TempSrc: Temporal  Weight: 177 lb 12.8 oz (80.6 kg)  Body mass index is 31.5 kg/m.   Physical Exam   General appearance: Well developed, well nourished female, cooperative and in no acute distress Head: Normocephalic, without obvious abnormality, atraumatic Respiratory: Respirations even and unlabored, normal respiratory rate Extremities: All extremities are intact.  Skin: Skin color,  texture, turgor normal. No rashes seen  Psych: Appropriate mood and affect. Neurologic: Mental status: Alert, oriented to person, place, and time, thought content appropriate.   Lab Results  Component Value Date   CHOL 245 (H) 05/29/2019   HDL 46 05/29/2019   LDLCALC 100 (H) 05/29/2019   TRIG 585 (HH) 05/29/2019   CHOLHDL 5.3 (H) 05/29/2019   BMET    Component Value Date/Time   NA 138 05/29/2019 1504   K 3.7 05/29/2019 1504   CL 98 05/29/2019 1504   CO2 23 05/29/2019 1504   GLUCOSE 95 05/29/2019 1504   GLUCOSE 145 (H) 11/01/2013 0533   BUN 10 05/29/2019 1504   CREATININE 0.68 05/29/2019 1504   CALCIUM 9.9 05/29/2019 1504   GFRNONAA 100 05/29/2019 1504   GFRAA 115 05/29/2019 1504       Assessment & Plan    1. Essential (primary) hypertension  - valsartan (DIOVAN) 160 MG tablet; Take 1 tablet (160 mg total) by mouth daily.  Dispense: 30 tablet; Refill: 1  2. Hypertriglyceridemia  - atorvastatin (LIPITOR) 10 MG tablet; Take 1 tablet (10 mg total) by mouth daily.  Dispense: 30 tablet; Refill: 1  Follow up BP and lipids in 1 month.   The entirety of the information documented in the History of Present Illness, Review of Systems and Physical Exam were personally obtained by me. Portions of this information were initially documented by Meyer Cory, CMA and reviewed by me for thoroughness and accuracy.      Lelon Huh, MD  [facility]

## 2019-05-30 ENCOUNTER — Encounter: Payer: Self-pay | Admitting: Family Medicine

## 2019-05-30 DIAGNOSIS — B159 Hepatitis A without hepatic coma: Secondary | ICD-10-CM

## 2019-05-30 HISTORY — DX: Hepatitis a without hepatic coma: B15.9

## 2019-05-30 LAB — RENAL FUNCTION PANEL
Albumin: 4.5 g/dL (ref 3.8–4.9)
BUN/Creatinine Ratio: 15 (ref 9–23)
BUN: 10 mg/dL (ref 6–24)
CO2: 23 mmol/L (ref 20–29)
Calcium: 9.9 mg/dL (ref 8.7–10.2)
Chloride: 98 mmol/L (ref 96–106)
Creatinine, Ser: 0.68 mg/dL (ref 0.57–1.00)
GFR calc Af Amer: 115 mL/min/{1.73_m2} (ref >59)
GFR calc non Af Amer: 100 mL/min/{1.73_m2} (ref >59)
Glucose: 95 mg/dL (ref 65–99)
Phosphorus: 3.7 mg/dL (ref 3.0–4.3)
Potassium: 3.7 mmol/L (ref 3.5–5.2)
Sodium: 138 mmol/L (ref 134–144)

## 2019-05-30 LAB — LIPID PANEL
Chol/HDL Ratio: 5.3 ratio — ABNORMAL HIGH (ref 0.0–4.4)
Cholesterol, Total: 245 mg/dL — ABNORMAL HIGH (ref 100–199)
HDL: 46 mg/dL (ref >39)
LDL Chol Calc (NIH): 100 mg/dL — ABNORMAL HIGH (ref 0–99)
Triglycerides: 585 mg/dL (ref 0–149)
VLDL Cholesterol Cal: 99 mg/dL — ABNORMAL HIGH (ref 5–40)

## 2019-05-30 MED ORDER — ATORVASTATIN CALCIUM 10 MG PO TABS: 10.0000 mg | ORAL_TABLET | Freq: Every day | ORAL | 1 refills | Status: DC

## 2019-05-30 MED ORDER — VALSARTAN 160 MG PO TABS: 160.0000 mg | ORAL_TABLET | Freq: Every day | ORAL | 1 refills | Status: DC

## 2019-06-07 NOTE — Patient Instructions (Signed)
.   Please review the attached list of medications and notify my office if there are any errors.   . Please bring all of your medications to every appointment so we can make sure that our medication list is the same as yours.   . It is especially important to get the annual flu vaccine this year. If you haven't had it already, please go to your pharmacy or call the office as soon as possible to schedule you flu shot.  

## 2019-06-08 DIAGNOSIS — Z20828 Contact with and (suspected) exposure to other viral communicable diseases: Secondary | ICD-10-CM | POA: Diagnosis not present

## 2019-06-12 ENCOUNTER — Encounter: Payer: Self-pay | Admitting: Family Medicine

## 2019-06-12 ENCOUNTER — Ambulatory Visit: Payer: Self-pay | Admitting: *Deleted

## 2019-06-12 ENCOUNTER — Other Ambulatory Visit: Payer: Self-pay

## 2019-06-12 ENCOUNTER — Ambulatory Visit (INDEPENDENT_AMBULATORY_CARE_PROVIDER_SITE_OTHER): Payer: Federal, State, Local not specified - PPO | Admitting: Family Medicine

## 2019-06-12 DIAGNOSIS — Z5329 Procedure and treatment not carried out because of patient's decision for other reasons: Secondary | ICD-10-CM

## 2019-06-12 DIAGNOSIS — Z91199 Patient's noncompliance with other medical treatment and regimen due to unspecified reason: Secondary | ICD-10-CM

## 2019-06-12 NOTE — Progress Notes (Signed)
Patient did not show for virtual visit. Attempted to call on telephone x 3 and rolled into Voicemail.

## 2019-06-12 NOTE — Telephone Encounter (Signed)
From [facility] 

## 2019-06-12 NOTE — Telephone Encounter (Signed)
Pt stated she has had an on and off again fever of 102 since Tuesday 06/06/19. Pt stated she takes Tylenol and it comes down but will go back up again and she is having sinus and ear discomfort as well. She was tested at [facility] on 06/08/19 and tested negative for covid 19 and flu;the pt has not taken decongestants due to HTN; recommendations made per nurse triage protocol; she verbalized understanding; decision tree completed; the pt sees Dr Caryn Section, [facility]; pt transferred to [facility] for scheduling.       Reason for Disposition . Fever present > 3 days (72 hours)  Answer Assessment - Initial Assessment Questions 1. LOCATION: "Where does it hurt?"     Behind left eye 2. ONSET: "When did the sinus pain start?"  (e.g., hours, days)    06/06/2019 3. SEVERITY: "How bad is the pain?"   (Scale 1-10; mild, moderate or severe)   - MILD (1-3): doesn't interfere with normal activities    - MODERATE (4-7): interferes with normal activities (e.g., work or school) or awakens from sleep   - SEVERE (8-10): excruciating pain and patient unable to do any normal activities       Rated 5 out of 10 4. RECURRENT SYMPTOM: "Have you ever had sinus problems before?" If so, ask: "When was the last time?" and "What happened that time?"   no 5. NASAL CONGESTION: "Is the nose blocked?" If so, ask, "Can you open it or must you breathe through the mouth?"    no 6. NASAL DISCHARGE: "Do you have discharge from your nose?" If so ask, "What color?"   Post nasal drip 7. FEVER: "Do you have a fever?" If so, ask: "What is it, how was it measured, and when did it start?"   last temp 102 oral thermometer 8. OTHER SYMPTOMS: "Do you have any other symptoms?" (e.g., sore throat, cough, earache, difficulty breathing)    Post nasal; sharp stabbing pain in left ear rated 5 out of 10  9. PREGNANCY: "Is there any chance you are pregnant?" "When was your last menstrual period?"     No hysterectomy  Protocols used:  SINUS PAIN OR CONGESTION-A-AH

## 2019-06-13 ENCOUNTER — Other Ambulatory Visit: Payer: Self-pay

## 2019-06-13 ENCOUNTER — Encounter (HOSPITAL_COMMUNITY): Payer: Self-pay

## 2019-06-13 ENCOUNTER — Emergency Department (HOSPITAL_COMMUNITY): Payer: Federal, State, Local not specified - PPO

## 2019-06-13 ENCOUNTER — Observation Stay (HOSPITAL_COMMUNITY)
Admission: EM | Admit: 2019-06-13 | Discharge: 2019-06-15 | Disposition: A | Discharge status: Home / Self Care | Payer: Federal, State, Local not specified - PPO | Attending: Internal Medicine | Admitting: Internal Medicine

## 2019-06-13 DIAGNOSIS — Z79899 Other long term (current) drug therapy: Secondary | ICD-10-CM | POA: Diagnosis not present

## 2019-06-13 DIAGNOSIS — K7689 Other specified diseases of liver: Secondary | ICD-10-CM | POA: Diagnosis not present

## 2019-06-13 DIAGNOSIS — E785 Hyperlipidemia, unspecified: Secondary | ICD-10-CM | POA: Insufficient documentation

## 2019-06-13 DIAGNOSIS — B179 Acute viral hepatitis, unspecified: Secondary | ICD-10-CM | POA: Diagnosis not present

## 2019-06-13 DIAGNOSIS — M1990 Unspecified osteoarthritis, unspecified site: Secondary | ICD-10-CM | POA: Insufficient documentation

## 2019-06-13 DIAGNOSIS — Z85828 Personal history of other malignant neoplasm of skin: Secondary | ICD-10-CM | POA: Diagnosis not present

## 2019-06-13 DIAGNOSIS — F329 Major depressive disorder, single episode, unspecified: Secondary | ICD-10-CM | POA: Insufficient documentation

## 2019-06-13 DIAGNOSIS — Z87891 Personal history of nicotine dependence: Secondary | ICD-10-CM | POA: Insufficient documentation

## 2019-06-13 DIAGNOSIS — R509 Fever, unspecified: Secondary | ICD-10-CM | POA: Diagnosis not present

## 2019-06-13 DIAGNOSIS — E860 Dehydration: Secondary | ICD-10-CM

## 2019-06-13 DIAGNOSIS — Z888 Allergy status to other drugs, medicaments and biological substances status: Secondary | ICD-10-CM | POA: Diagnosis not present

## 2019-06-13 DIAGNOSIS — Z96641 Presence of right artificial hip joint: Secondary | ICD-10-CM | POA: Insufficient documentation

## 2019-06-13 DIAGNOSIS — Z8249 Family history of ischemic heart disease and other diseases of the circulatory system: Secondary | ICD-10-CM | POA: Insufficient documentation

## 2019-06-13 DIAGNOSIS — I10 Essential (primary) hypertension: Secondary | ICD-10-CM | POA: Diagnosis present

## 2019-06-13 DIAGNOSIS — D689 Coagulation defect, unspecified: Secondary | ICD-10-CM

## 2019-06-13 DIAGNOSIS — Z20828 Contact with and (suspected) exposure to other viral communicable diseases: Secondary | ICD-10-CM | POA: Diagnosis not present

## 2019-06-13 DIAGNOSIS — F1010 Alcohol abuse, uncomplicated: Secondary | ICD-10-CM | POA: Insufficient documentation

## 2019-06-13 DIAGNOSIS — Z885 Allergy status to narcotic agent status: Secondary | ICD-10-CM | POA: Insufficient documentation

## 2019-06-13 DIAGNOSIS — K7200 Acute and subacute hepatic failure without coma: Secondary | ICD-10-CM | POA: Insufficient documentation

## 2019-06-13 DIAGNOSIS — F419 Anxiety disorder, unspecified: Secondary | ICD-10-CM | POA: Diagnosis not present

## 2019-06-13 DIAGNOSIS — K760 Fatty (change of) liver, not elsewhere classified: Secondary | ICD-10-CM | POA: Insufficient documentation

## 2019-06-13 DIAGNOSIS — R079 Chest pain, unspecified: Secondary | ICD-10-CM | POA: Diagnosis not present

## 2019-06-13 LAB — CBC WITH DIFFERENTIAL/PLATELET
Abs Immature Granulocytes: 0.06 10*3/uL (ref 0.00–0.07)
Basophils Absolute: 0.1 10*3/uL (ref 0.0–0.1)
Basophils Relative: 2 %
Eosinophils Absolute: 0.1 10*3/uL (ref 0.0–0.5)
Eosinophils Relative: 1 %
HCT: 50.8 % — ABNORMAL HIGH (ref 36.0–46.0)
Hemoglobin: 17 g/dL — ABNORMAL HIGH (ref 12.0–15.0)
Immature Granulocytes: 1 %
Lymphocytes Relative: 34 %
Lymphs Abs: 2.4 10*3/uL (ref 0.7–4.0)
MCH: 30.2 pg (ref 26.0–34.0)
MCHC: 33.5 g/dL (ref 30.0–36.0)
MCV: 90.2 fL (ref 80.0–100.0)
Monocytes Absolute: 0.5 10*3/uL (ref 0.1–1.0)
Monocytes Relative: 7 %
Neutro Abs: 3.9 10*3/uL (ref 1.7–7.7)
Neutrophils Relative %: 55 %
Platelets: 270 10*3/uL (ref 150–400)
RBC: 5.63 MIL/uL — ABNORMAL HIGH (ref 3.87–5.11)
RDW: 13.2 % (ref 11.5–15.5)
WBC: 7.1 10*3/uL (ref 4.0–10.5)
nRBC: 0 % (ref 0.0–0.2)

## 2019-06-13 LAB — COMPREHENSIVE METABOLIC PANEL
ALT: 5730 U/L — ABNORMAL HIGH (ref 0–44)
AST: 4101 U/L — ABNORMAL HIGH (ref 15–41)
Albumin: 3.7 g/dL (ref 3.5–5.0)
Alkaline Phosphatase: 141 U/L — ABNORMAL HIGH (ref 38–126)
Anion gap: 12 (ref 5–15)
BUN: 8 mg/dL (ref 6–20)
CO2: 27 mmol/L (ref 22–32)
Calcium: 8.7 mg/dL — ABNORMAL LOW (ref 8.9–10.3)
Chloride: 93 mmol/L — ABNORMAL LOW (ref 98–111)
Creatinine, Ser: 0.59 mg/dL (ref 0.44–1.00)
GFR calc Af Amer: >60 mL/min (ref >60)
GFR calc non Af Amer: >60 mL/min (ref >60)
Glucose, Bld: 115 mg/dL — ABNORMAL HIGH (ref 70–99)
Potassium: 3.8 mmol/L (ref 3.5–5.1)
Sodium: 132 mmol/L — ABNORMAL LOW (ref 135–145)
Total Bilirubin: 4.2 mg/dL — ABNORMAL HIGH (ref 0.3–1.2)
Total Protein: 7.3 g/dL (ref 6.5–8.1)

## 2019-06-13 LAB — PROTIME-INR
INR: 1.8 — ABNORMAL HIGH (ref 0.8–1.2)
Prothrombin Time: 20.5 seconds — ABNORMAL HIGH (ref 11.4–15.2)

## 2019-06-13 LAB — RETICULOCYTES
Immature Retic Fract: 6.5 % (ref 2.3–15.9)
RBC.: 5.61 MIL/uL — ABNORMAL HIGH (ref 3.87–5.11)
Retic Count, Absolute: 50.5 10*3/uL (ref 19.0–186.0)
Retic Ct Pct: 0.9 % (ref 0.4–3.1)

## 2019-06-13 LAB — POC SARS CORONAVIRUS 2 AG: SARS Coronavirus 2 Ag: NEGATIVE

## 2019-06-13 LAB — MONONUCLEOSIS SCREEN: Mono Screen: NEGATIVE

## 2019-06-13 LAB — LACTATE DEHYDROGENASE: LDH: 2371 U/L — ABNORMAL HIGH (ref 98–192)

## 2019-06-13 LAB — APTT: aPTT: 34 seconds (ref 24–36)

## 2019-06-13 MED ORDER — SODIUM CHLORIDE 0.9 % IV BOLUS
1000.0000 mL | Freq: Once | INTRAVENOUS | Status: AC
  Administered 2019-06-13: 1000 mL via INTRAVENOUS

## 2019-06-13 MED ORDER — LACTATED RINGERS IV BOLUS
1000.0000 mL | Freq: Once | INTRAVENOUS | Status: AC
  Administered 2019-06-13: 1000 mL via INTRAVENOUS

## 2019-06-13 MED ORDER — ACETAMINOPHEN 325 MG PO TABS
650.0000 mg | ORAL_TABLET | Freq: Once | ORAL | Status: AC
  Administered 2019-06-13: 650 mg via ORAL
  Filled 2019-06-13: qty 2

## 2019-06-13 NOTE — ED Notes (Signed)
US at bedside

## 2019-06-13 NOTE — ED Provider Notes (Signed)
MSE was initiated and I personally evaluated the patient and placed orders (if any) at  5:33 PM on June 13, 2019.  The patient appears stable so that the remainder of the MSE may be completed by another provider.  53 year old female who presents for evaluation of continued and persistent generalized body aches, fatigue, shortness of breath, chest pain.  She reports that she initially started experiencing symptoms on 12/8-20.  She was evaluated on 06/08/2019 and had a Covid and flu that resulted negative.  She comes in today because she continues to have symptoms.  She states she feels very tired and very fatigued.  She reports about 2 days ago, she noted dark urine.  She states she has not had any dysuria.  States she is still had a fever.  She reports chest pain that started about 3 days ago.  She describes it as an achy type pain.  She denies any abdominal pain, nausea/vomiting.  She does not know of any known COVID-19 exposure.  Lungs clear to auscultation bilaterally.  No evidence of respiratory distress.  Abdomen soft, distended, nontender.  Portions of this note were generated with [employer]. Dictation errors may occur despite best attempts at proofreading.     Volanda Napoleon, PA-C 06/13/19 [place], Ankit, MD 06/13/19 1744

## 2019-06-13 NOTE — ED Triage Notes (Signed)
Since the 8th patient c/o fever, body aches.  negative for covid and flu on 12th but, still having cough, chest pain, and dark urine.   Temp-99 in triage.

## 2019-06-13 NOTE — ED Provider Notes (Signed)
[facility] DEPT Provider Note   CSN: [id number] Arrival date & time: 06/13/19  1721     History No chief complaint on file.   Olivia Meyer is a 53 y.o. female.  HPI     53 year old female comes in a chief complaint of generalized weakness. Patient has history of hypertension and admits to binge drinking when she drinks.  She does not drink every day.  She reports that she has been feeling unwell for the last week or so.  She has had Covid test which has been negative.  Her symptoms include nausea, anorexia, generalized weakness, body aches, subjective fevers.  She has no cough.  Her urine has turned dark.  Because her symptoms getting worse she decided to come to the ED.  Past Medical History:  Diagnosis Date  . Anxiety   . Arthritis    RIGHT HIP OA AND PAIN  . Cancer (HCC)    BASAL CANCER REMOVED LEFT UPPER LIP  . Depression   . Expected blood loss anemia 11/01/2013  . History of chicken pox   . Hypertension   . Numbness    RIGHT LEG NUMBNESS SINCE NOV 2014 - HAS HAD ULTRASOUND - NO BLOOD CLOT     Patient Active Problem List   Diagnosis Date Noted  . Pre-diabetes 04/30/2019  . Hyperglycemia 03/15/2018  . Chronic migraine 09/27/2017  . 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 Hip osteoarthritis 02/05/2015  . History of basal cell cancer 02/05/2015  . Low back pain radiating to right leg 02/05/2015  . Menopausal symptom 02/05/2015  . Overweight (BMI 25.0-29.9) 11/01/2013  . S/P total hip arthroplasty 10/31/2013  . Hypertriglyceridemia 02/01/2009  . Family history of colonic polyps 09/18/2007  . Essential (primary) hypertension 08/22/2007  . Anxiety 06/29/2004  . Depressive disorder 06/29/2004  . History of herpes genitalis 06/29/2004    Past Surgical History:  Procedure Laterality Date  . 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 APPENDECTOMY  2001  . BASAL CELL CARCINOMA EXCISION  2008  . BREAST SURGERY  1998   BREAST AUGMENTATION  . [place]  . HIP ARTHROSCOPY Right 2013  . MOHS SURGERY  2014   LEFT UPPER LIP  . TOTAL HIP ARTHROPLASTY Right 10/31/2013   Procedure: RIGHT TOTAL HIP ARTHROPLASTY ANTERIOR APPROACH;  Surgeon: Mauri Pole, MD;  Location: [facility];  Service: Orthopedics;  Laterality: Right;  . TUBAL LIGATION  1999     OB History    Gravida  4   Para  3   Term      Preterm      AB      Living        SAB      TAB      Ectopic      Multiple      Live Births              Family History  Problem Relation Age of Onset  . Kidney failure Father   . Non-Hodgkin's lymphoma Father   . Cervical cancer Mother   . Melanoma Mother   . Hypertension Mother   . Irritable bowel syndrome Mother   . Dementia Mother   . Vascular Disease Maternal Grandmother   . Stomach cancer Maternal Grandfather   . ALS Paternal Grandfather   . ALS Paternal Aunt   . ALS Cousin     Social History   Tobacco Use  .  Smoking status: Former Smoker    Quit date: 06/29/1985    Years since quitting: 33.9  . Smokeless tobacco: Never Used  Substance Use Topics  . Alcohol use: Yes    Alcohol/week: 0.0 standard drinks    Comment:   WINE ON WEEK ENDS ( MAYBE 3-4 GLASSES A WEEK )  . Drug use: No    Home Medications Prior to Admission medications   Medication Sig Start Date End Date Taking? Authorizing Provider  ALPRAZolam Duanne Moron) 0.5 MG tablet Take 1 tablet (0.5 mg total) by mouth 2 (two) times daily as needed. for anxiety 04/18/19  Yes [name], MD  amLODipine (NORVASC) 5 MG tablet Take 5 mg by mouth daily.   Yes [provider]  ASHWAGANDHA PO Take 1 tablet by mouth daily.    Yes [provider]  B COMPLEX VITAMINS PO Take 1 tablet by mouth daily. 02/03/10  Yes [provider]  Cholecalciferol (VITAMIN D-3 PO) Take 1 capsule by mouth daily.    Yes [provider]  Omega-3 Fatty Acids (FISH OIL) 1000 MG CAPS Take 1 capsule  by mouth daily. 01/31/09  Yes [provider]  Rhodiola rosea (RHODIOLA PO) Take 1 capsule by mouth daily.    Yes [provider]  venlafaxine XR (EFFEXOR-XR) 75 MG 24 hr capsule Take 1 capsule (75 mg total) by mouth daily with breakfast. 12/06/18  Yes Marcial Pacas, MD  VITAMIN E PO Take 1 capsule by mouth daily.  01/31/09  Yes [provider]  atorvastatin (LIPITOR) 10 MG tablet Take 1 tablet (10 mg total) by mouth daily. Patient not taking: Reported on 06/13/2019 05/30/19   [name], MD    Allergies    Ace inhibitors, Morphine and related, and Prednisone  Review of Systems   Review of Systems  Constitutional: Positive for activity change.  Respiratory: Negative for shortness of breath.   Cardiovascular: Negative for chest pain.  Gastrointestinal: Positive for abdominal pain and nausea.  Allergic/Immunologic: Negative for immunocompromised state.  Hematological: Does not bruise/bleed easily.  All other systems reviewed and are negative.   Physical Exam Updated Vital Signs BP 126/86   Pulse 85   Temp 99 F (37.2 C) (Oral)   Resp 12   SpO2 96%   Physical Exam Vitals and nursing note reviewed.  Constitutional:      Appearance: She is well-developed.  HENT:     Head: Normocephalic and atraumatic.  Cardiovascular:     Rate and Rhythm: Normal rate.  Pulmonary:     Effort: Pulmonary effort is normal.  Abdominal:     General: Bowel sounds are normal.     Tenderness: There is no abdominal tenderness.  Musculoskeletal:     Cervical back: Normal range of motion and neck supple.  Skin:    General: Skin is warm and dry.  Neurological:     Mental Status: She is alert and oriented to person, place, and time.     ED Results / Procedures / Treatments   Labs (all labs ordered are listed, but only abnormal results are displayed) Labs Reviewed  COMPREHENSIVE METABOLIC PANEL - Abnormal; Notable for the following components:      Result Value   Sodium  132 (*)    Chloride 93 (*)    Glucose, Bld 115 (*)    Calcium 8.7 (*)    AST 4,101 (*)    ALT 5,730 (*)    Alkaline Phosphatase 141 (*)    Total Bilirubin  4.2 (*)    All other components within normal limits  CBC WITH DIFFERENTIAL/PLATELET - Abnormal; Notable for the following components:   RBC 5.63 (*)    Hemoglobin 17.0 (*)    HCT 50.8 (*)    All other components within normal limits  LACTATE DEHYDROGENASE - Abnormal; Notable for the following components:   LDH 2,371 (*)    All other components within normal limits  RETICULOCYTES - Abnormal; Notable for the following components:   RBC. 5.61 (*)    All other components within normal limits  SARS CORONAVIRUS 2 (TAT 6-24 HRS)  URINALYSIS, ROUTINE W REFLEX MICROSCOPIC  MONONUCLEOSIS SCREEN  APTT  PROTIME-INR  POC SARS CORONAVIRUS 2 AG -  ED  POC SARS CORONAVIRUS 2 AG    EKG EKG Interpretation  Date/Time:  Tuesday June 13 2019 17:36:58 EST Ventricular Rate:  86 PR Interval:    QRS Duration: 92 QT Interval:  352 QTC Calculation: 421 R Axis:   40 Text Interpretation: Sinus rhythm RSR' in V1 or V2, right VCD or RVH Confirmed by Madalyn Rob [phone]) on 06/13/2019 8:08:28 PM   Radiology DG Chest 2 View  Result Date: 06/13/2019 CLINICAL DATA:  Chest pain, fever, body aches. EXAM: CHEST - 2 VIEW COMPARISON:  10/24/2013 FINDINGS: Heart and mediastinal contours are within normal limits. No focal opacities or effusions. No acute bony abnormality. IMPRESSION: No active cardiopulmonary disease. Electronically Signed   By: Rolm Baptise M.D.   On: 06/13/2019 18:11    Procedures .Critical Care Performed by: Varney Biles, MD Authorized by: Varney Biles, MD   Critical care provider statement:    Critical care time (minutes):  45   Critical care was necessary to treat or prevent imminent or life-threatening deterioration of the following conditions:  Hepatic failure   Critical care was time spent personally by me on  the following activities:  Discussions with consultants, evaluation of patient's response to treatment, examination of patient, ordering and performing treatments and interventions, ordering and review of laboratory studies, ordering and review of radiographic studies, pulse oximetry, re-evaluation of patient's condition, obtaining history from patient or surrogate and review of old charts   (including critical care time)  Medications Ordered in ED Medications  sodium chloride 0.9 % bolus 1,000 mL (1,000 mLs Intravenous New Bag/Given 06/13/19 2303)  acetaminophen (TYLENOL) tablet 650 mg (650 mg Oral Given 06/13/19 2253)  lactated ringers bolus 1,000 mL (1,000 mLs Intravenous New Bag/Given 06/13/19 2303)    ED Course  I have reviewed the triage vital signs and the nursing notes.  Pertinent labs & imaging results that were available during my care of the patient were reviewed by me and considered in my medical decision making (see chart for details).    MDM Rules/Calculators/A&P                      SHENEQUA HOWSE was evaluated in Emergency Department on 06/14/2019 for the symptoms described in the history of present illness. She was evaluated in the context of the global COVID-19 pandemic, which necessitated consideration that the patient might be at risk for infection with the SARS-CoV-2 virus that causes COVID-19. Institutional protocols and algorithms that pertain to the evaluation of patients at risk for COVID-19 are in a state of rapid change based on information released by regulatory bodies including the CDC and federal and state organizations. These policies and algorithms were followed during the patient's care in the ED.  53 year old female comes in  a chief complaint of weakness, nausea, anorexia and subjective fevers.  She reports negative Covid test but did have work-related exposure.  She denies any UTI-like symptoms, severe abdominal pain, cough, shortness of breath.  She does  admit to binge drinking intermittently but there is no history of alcoholism and she denies any other illicit drug use.  Unclear as to what is going on.  Suspect electrolyte abnormalities given her prolonged nausea and anorexia.  Additionally COVID-19 is still in the differential  Reassessment: Patient's LFTs are profoundly elevated.  LDH is over 2000 and bilirubin is over 4.  Alk phos is also elevated. Ultrasound right upper quadrant ordered after discussing case with Dr. Arelia Longest, GI.  Recommends the patient be admitted to the hospital for further work-up.  Differential now includes lymphoma and cancers as well.  Medicine to complete further work-up.   Final Clinical Impression(s) / ED Diagnoses Final diagnoses:  Acute liver failure without hepatic coma  Dehydration    Rx / DC Orders ED Discharge Orders    None       Varney Biles, MD 06/14/19 [phone]

## 2019-06-14 ENCOUNTER — Encounter (HOSPITAL_COMMUNITY): Payer: Self-pay | Admitting: Internal Medicine

## 2019-06-14 DIAGNOSIS — B179 Acute viral hepatitis, unspecified: Secondary | ICD-10-CM

## 2019-06-14 DIAGNOSIS — D689 Coagulation defect, unspecified: Secondary | ICD-10-CM | POA: Diagnosis not present

## 2019-06-14 DIAGNOSIS — I10 Essential (primary) hypertension: Secondary | ICD-10-CM | POA: Diagnosis not present

## 2019-06-14 LAB — BASIC METABOLIC PANEL
Anion gap: 15 (ref 5–15)
BUN: 8 mg/dL (ref 6–20)
CO2: 24 mmol/L (ref 22–32)
Calcium: 8.7 mg/dL — ABNORMAL LOW (ref 8.9–10.3)
Chloride: 94 mmol/L — ABNORMAL LOW (ref 98–111)
Creatinine, Ser: 0.57 mg/dL (ref 0.44–1.00)
GFR calc Af Amer: >60 mL/min (ref >60)
GFR calc non Af Amer: >60 mL/min (ref >60)
Glucose, Bld: 90 mg/dL (ref 70–99)
Potassium: 4.1 mmol/L (ref 3.5–5.1)
Sodium: 133 mmol/L — ABNORMAL LOW (ref 135–145)

## 2019-06-14 LAB — CBC WITH DIFFERENTIAL/PLATELET
Abs Immature Granulocytes: 0.07 10*3/uL (ref 0.00–0.07)
Basophils Absolute: 0.1 10*3/uL (ref 0.0–0.1)
Basophils Relative: 2 %
Eosinophils Absolute: 0.1 10*3/uL (ref 0.0–0.5)
Eosinophils Relative: 2 %
HCT: 43.5 % (ref 36.0–46.0)
Hemoglobin: 14.4 g/dL (ref 12.0–15.0)
Immature Granulocytes: 1 %
Lymphocytes Relative: 44 %
Lymphs Abs: 3 10*3/uL (ref 0.7–4.0)
MCH: 30.3 pg (ref 26.0–34.0)
MCHC: 33.1 g/dL (ref 30.0–36.0)
MCV: 91.4 fL (ref 80.0–100.0)
Monocytes Absolute: 0.5 10*3/uL (ref 0.1–1.0)
Monocytes Relative: 7 %
Neutro Abs: 3.1 10*3/uL (ref 1.7–7.7)
Neutrophils Relative %: 44 %
Platelets: 257 10*3/uL (ref 150–400)
RBC: 4.76 MIL/uL (ref 3.87–5.11)
RDW: 13.6 % (ref 11.5–15.5)
WBC: 6.9 10*3/uL (ref 4.0–10.5)
nRBC: 0 % (ref 0.0–0.2)

## 2019-06-14 LAB — HEPATIC FUNCTION PANEL
ALT: 5949 U/L — ABNORMAL HIGH (ref 0–44)
AST: 4448 U/L — ABNORMAL HIGH (ref 15–41)
Albumin: 3.7 g/dL (ref 3.5–5.0)
Alkaline Phosphatase: 133 U/L — ABNORMAL HIGH (ref 38–126)
Bilirubin, Direct: 2 mg/dL — ABNORMAL HIGH (ref 0.0–0.2)
Indirect Bilirubin: 2.1 mg/dL — ABNORMAL HIGH (ref 0.3–0.9)
Total Bilirubin: 4.1 mg/dL — ABNORMAL HIGH (ref 0.3–1.2)
Total Protein: 7.4 g/dL (ref 6.5–8.1)

## 2019-06-14 LAB — HEPATITIS PANEL, ACUTE
HCV Ab: NONREACTIVE
Hep A IgM: REACTIVE — AB
Hep B C IgM: NONREACTIVE
Hepatitis B Surface Ag: NONREACTIVE

## 2019-06-14 LAB — SEDIMENTATION RATE: Sed Rate: 10 mm/hr (ref 0–22)

## 2019-06-14 LAB — URINALYSIS, ROUTINE W REFLEX MICROSCOPIC
Bilirubin Urine: NEGATIVE
Glucose, UA: NEGATIVE mg/dL
Hgb urine dipstick: NEGATIVE
Ketones, ur: NEGATIVE mg/dL
Leukocytes,Ua: NEGATIVE
Nitrite: NEGATIVE
Protein, ur: NEGATIVE mg/dL
Specific Gravity, Urine: 1.006 (ref 1.005–1.030)
pH: 6 (ref 5.0–8.0)

## 2019-06-14 LAB — SALICYLATE LEVEL: Salicylate Lvl: <7 mg/dL (ref 2.8–30.0)

## 2019-06-14 LAB — SARS CORONAVIRUS 2 (TAT 6-24 HRS): SARS Coronavirus 2: NEGATIVE

## 2019-06-14 LAB — PROTIME-INR
INR: 2.4 — ABNORMAL HIGH (ref 0.8–1.2)
Prothrombin Time: 25.9 seconds — ABNORMAL HIGH (ref 11.4–15.2)

## 2019-06-14 LAB — HIV ANTIBODY (ROUTINE TESTING W REFLEX): HIV Screen 4th Generation wRfx: NONREACTIVE

## 2019-06-14 LAB — ACETAMINOPHEN LEVEL: Acetaminophen (Tylenol), Serum: <10 ug/mL — ABNORMAL LOW (ref 10–30)

## 2019-06-14 MED ORDER — ONDANSETRON HCL 4 MG PO TABS: 4.0000 mg | ORAL_TABLET | Freq: Four times a day (QID) | ORAL | Status: DC | PRN

## 2019-06-14 MED ORDER — ONDANSETRON HCL 4 MG/2ML IJ SOLN: 4.0000 mg | Freq: Four times a day (QID) | INTRAMUSCULAR | Status: DC | PRN

## 2019-06-14 MED ORDER — ALPRAZOLAM 0.5 MG PO TABS
0.5000 mg | ORAL_TABLET | Freq: Two times a day (BID) | ORAL | Status: DC | PRN
  Administered 2019-06-14: 0.5 mg via ORAL
  Filled 2019-06-14: qty 1

## 2019-06-14 MED ORDER — AMLODIPINE BESYLATE 5 MG PO TABS
5.0000 mg | ORAL_TABLET | Freq: Every day | ORAL | Status: DC
  Administered 2019-06-14 – 2019-06-15 (×2): 5 mg via ORAL
  Filled 2019-06-14 (×2): qty 1

## 2019-06-14 NOTE — ED Notes (Signed)
Provided patient ice water.

## 2019-06-14 NOTE — ED Notes (Signed)
ED TO INPATIENT HANDOFF REPORT  ED Nurse Name and Phone #: Jarrett Soho [phone]  S Name/Age/Gender Olivia Meyer 53 y.o. female Room/Bed: WA11/WA11  Code Status   Code Status: Full Code  Home/SNF/Other Home Patient oriented to: self, place, time and situation Is this baseline? Yes   Triage Complete: Triage complete  Chief Complaint Acute hepatitis [B17.9]  Triage Note Since the 8th patient c/o fever, body aches.  negative for covid and flu on 12th but, still having cough, chest pain, and dark urine.   Temp-99 in triage.     Allergies Allergies  Allergen Reactions  . Ace Inhibitors     COUGH AND THROAT IRRITATION  . Morphine And Related     ITCHING AND SKIN BLOTCHES  . Prednisone     makers her 'crazy'    Level of Care/Admitting Diagnosis ED Disposition    ED Disposition Condition [place]: [place] [[id number]]  Level of Care: Telemetry [5]  Admit to tele based on following criteria: Monitor for Ischemic changes  Covid Evaluation: Confirmed COVID Negative  Diagnosis: Acute hepatitis [id number]  Admitting Physician: Rise Patience [phone]  Attending Physician: Rise Patience [email or web address]       B Medical/Surgery History Past Medical History:  Diagnosis Date  . Anxiety   . Arthritis    RIGHT HIP OA AND PAIN  . Cancer (HCC)    BASAL CANCER REMOVED LEFT UPPER LIP  . Depression   . Expected blood loss anemia 11/01/2013  . History of chicken pox   . Hypertension   . Numbness    RIGHT LEG NUMBNESS SINCE NOV 2014 - HAS HAD ULTRASOUND - NO BLOOD CLOT    Past Surgical History:  Procedure Laterality Date  . 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 APPENDECTOMY  2001  . BASAL CELL CARCINOMA EXCISION  2008  . BREAST SURGERY  1998   BREAST AUGMENTATION  . [place]  . HIP ARTHROSCOPY Right 2013  . MOHS SURGERY  2014   LEFT UPPER LIP  . TOTAL HIP ARTHROPLASTY Right 10/31/2013    Procedure: RIGHT TOTAL HIP ARTHROPLASTY ANTERIOR APPROACH;  Surgeon: Mauri Pole, MD;  Location: [facility];  Service: Orthopedics;  Laterality: Right;  . TUBAL LIGATION  1999     A IV Location/Drains/Wounds Patient Lines/Drains/Airways Status   Active Line/Drains/Airways    Name:   Placement date:   Placement time:   Site:   Days:   Peripheral IV 06/14/19 Right;Medial Forearm   06/14/19    0304    Forearm   less than 1   Incision (Closed) 10/31/13 Hip Left   10/31/13    1235     2052          Intake/Output Last 24 hours  Intake/Output Summary (Last 24 hours) at 06/14/2019 1354 Last data filed at 06/14/2019 0241 Gross per 24 hour  Intake 2000 ml  Output --  Net 2000 ml    Labs/Imaging Results for orders placed or performed during the hospital encounter of 06/13/19 (from the past 48 hour(s))  Comprehensive metabolic panel     Status: Abnormal   Collection Time: 06/13/19  5:54 PM  Result Value Ref Range   Sodium 132 (L) 135 - 145 mmol/L   Potassium 3.8 3.5 - 5.1 mmol/L   Chloride 93 (L) 98 - 111 mmol/L   CO2 27 22 - 32 mmol/L   Glucose,  Bld 115 (H) 70 - 99 mg/dL   BUN 8 6 - 20 mg/dL   Creatinine, Ser 0.59 0.44 - 1.00 mg/dL   Calcium 8.7 (L) 8.9 - 10.3 mg/dL   Total Protein 7.3 6.5 - 8.1 g/dL   Albumin 3.7 3.5 - 5.0 g/dL   AST 4,101 (H) 15 - 41 U/L    Comment: RESULTS CONFIRMED BY MANUAL DILUTION   ALT 5,730 (H) 0 - 44 U/L    Comment: RESULTS CONFIRMED BY MANUAL DILUTION   Alkaline Phosphatase 141 (H) 38 - 126 U/L   Total Bilirubin 4.2 (H) 0.3 - 1.2 mg/dL   GFR calc non Af Amer >60 >60 mL/min   GFR calc Af Amer >60 >60 mL/min   Anion gap 12 5 - 15    Comment: Performed at [facility], [place] [address] [place], [place] [address]  CBC with Differential     Status: Abnormal   Collection Time: 06/13/19  5:54 PM  Result Value Ref Range   WBC 7.1 4.0 - 10.5 K/uL   RBC 5.63 (H) 3.87 - 5.11 MIL/uL   Hemoglobin 17.0 (H) 12.0 - 15.0 g/dL   HCT 50.8 (H)  36.0 - 46.0 %   MCV 90.2 80.0 - 100.0 fL   MCH 30.2 26.0 - 34.0 pg   MCHC 33.5 30.0 - 36.0 g/dL   RDW 13.2 11.5 - 15.5 %   Platelets 270 150 - 400 K/uL   nRBC 0.0 0.0 - 0.2 %   Neutrophils Relative % 55 %   Neutro Abs 3.9 1.7 - 7.7 K/uL   Lymphocytes Relative 34 %   Lymphs Abs 2.4 0.7 - 4.0 K/uL   Monocytes Relative 7 %   Monocytes Absolute 0.5 0.1 - 1.0 K/uL   Eosinophils Relative 1 %   Eosinophils Absolute 0.1 0.0 - 0.5 K/uL   Basophils Relative 2 %   Basophils Absolute 0.1 0.0 - 0.1 K/uL   Immature Granulocytes 1 %   Abs Immature Granulocytes 0.06 0.00 - 0.07 K/uL   Abnormal Lymphocytes Present PRESENT     Comment: Performed at [facility], [place] [address], [place], [place] [address]  Lactate dehydrogenase     Status: Abnormal   Collection Time: 06/13/19  5:54 PM  Result Value Ref Range   LDH 2,371 (H) 98 - 192 U/L    Comment: Performed at [facility], [place] [address] [place], [place] [address]  Reticulocytes     Status: Abnormal   Collection Time: 06/13/19  5:54 PM  Result Value Ref Range   Retic Ct Pct 0.9 0.4 - 3.1 %   RBC. 5.61 (H) 3.87 - 5.11 MIL/uL   Retic Count, Absolute 50.5 19.0 - 186.0 K/uL   Immature Retic Fract 6.5 2.3 - 15.9 %    Comment: Performed at [facility], [place] [address] [place], [place] [address]  POC SARS Coronavirus 2 Ag     Status: None   Collection Time: 06/13/19  6:34 PM  Result Value Ref Range   SARS Coronavirus 2 Ag NEGATIVE NEGATIVE    Comment: (NOTE) SARS-CoV-2 antigen NOT DETECTED.  Negative results are presumptive.  Negative results do not preclude SARS-CoV-2 infection and should not be used as the sole basis for treatment or other patient management decisions, including infection  control decisions, particularly in the presence of clinical signs and  symptoms consistent with COVID-19, or in those who have been in contact with the virus.  Negative  results must be combined  with clinical observations, patient history, and epidemiological information. The expected result is Negative. Fact Sheet for Patients: PodPark.tn Fact Sheet for Healthcare Providers: GiftContent.is This test is not yet approved or cleared by the [place] FDA and  has been authorized for detection and/or diagnosis of SARS-CoV-2 by FDA under an Emergency Use Authorization (EUA).  This EUA will remain in effect (meaning this test can be used) for the duration of  the COVID-19 de claration under Section 564(b)(1) of the Act, 21 U.S.C. section 360bbb-3(b)(1), unless the authorization is terminated or revoked sooner.   SARS CORONAVIRUS 2 (TAT 6-24 HRS) Nasopharyngeal Nasopharyngeal Swab     Status: None   Collection Time: 06/13/19 10:22 PM   Specimen: Nasopharyngeal Swab  Result Value Ref Range   SARS Coronavirus 2 NEGATIVE NEGATIVE    Comment: (NOTE) SARS-CoV-2 target nucleic acids are NOT DETECTED. The SARS-CoV-2 RNA is generally detectable in upper and lower respiratory specimens during the acute phase of infection. Negative results do not preclude SARS-CoV-2 infection, do not rule out co-infections with other pathogens, and should not be used as the sole basis for treatment or other patient management decisions. Negative results must be combined with clinical observations, patient history, and epidemiological information. The expected result is Negative. Fact Sheet for Patients: SugarRoll.be Fact Sheet for Healthcare Providers: https://www.woods-mathews.com/ This test is not yet approved or cleared by the [place] FDA and  has been authorized for detection and/or diagnosis of SARS-CoV-2 by FDA under an Emergency Use Authorization (EUA). This EUA will remain  in effect (meaning this test can be used) for the duration of the COVID-19 declaration under Section 56 4(b)(1) of the  Act, 21 U.S.C. section 360bbb-3(b)(1), unless the authorization is terminated or revoked sooner. Performed at [facility], [place] [address] [place], [place] [address]   Mononucleosis screen     Status: None   Collection Time: 06/13/19 10:34 PM  Result Value Ref Range   Mono Screen NEGATIVE NEGATIVE    Comment: Performed at [facility], [place] [address], [place], [place] [address]  APTT     Status: None   Collection Time: 06/13/19 10:34 PM  Result Value Ref Range   aPTT 34 24 - 36 seconds    Comment: Performed at [facility], [place] [address], [place], [place] [address]  Protime-INR     Status: Abnormal   Collection Time: 06/13/19 10:34 PM  Result Value Ref Range   Prothrombin Time 20.5 (H) 11.4 - 15.2 seconds   INR 1.8 (H) 0.8 - 1.2    Comment: (NOTE) INR goal varies based on device and disease states. Performed at [facility], [place] [address] [place], [place] [address]   Urinalysis, Routine w reflex microscopic     Status: Abnormal   Collection Time: 06/14/19 12:33 AM  Result Value Ref Range   Color, Urine AMBER (A) YELLOW    Comment: BIOCHEMICALS MAY BE AFFECTED BY COLOR   APPearance CLEAR CLEAR   Specific Gravity, Urine 1.006 1.005 - 1.030   pH 6.0 5.0 - 8.0   Glucose, UA NEGATIVE NEGATIVE mg/dL   Hgb urine dipstick NEGATIVE NEGATIVE   Bilirubin Urine NEGATIVE NEGATIVE   Ketones, ur NEGATIVE NEGATIVE mg/dL   Protein, ur NEGATIVE NEGATIVE mg/dL   Nitrite NEGATIVE NEGATIVE   Leukocytes,Ua NEGATIVE NEGATIVE    Comment: Performed at [place] [address] [place], [place] [address]  Hepatic function panel  Status: Abnormal   Collection Time: 06/14/19  2:39 AM  Result Value Ref Range   Total Protein 7.4 6.5 - 8.1 g/dL   Albumin 3.7 3.5 - 5.0 g/dL   AST 4,448 (H) 15 - 41 U/L    Comment: RESULTS CONFIRMED BY MANUAL DILUTION   ALT 5,949 (H) 0 - 44 U/L    Comment: RESULTS CONFIRMED  BY MANUAL DILUTION   Alkaline Phosphatase 133 (H) 38 - 126 U/L   Total Bilirubin 4.1 (H) 0.3 - 1.2 mg/dL   Bilirubin, Direct 2.0 (H) 0.0 - 0.2 mg/dL   Indirect Bilirubin 2.1 (H) 0.3 - 0.9 mg/dL    Comment: Performed at [facility], [place] [address], [place], [place] [id number]  Basic metabolic panel     Status: Abnormal   Collection Time: 06/14/19  2:39 AM  Result Value Ref Range   Sodium 133 (L) 135 - 145 mmol/L   Potassium 4.1 3.5 - 5.1 mmol/L   Chloride 94 (L) 98 - 111 mmol/L   CO2 24 22 - 32 mmol/L   Glucose, Bld 90 70 - 99 mg/dL   BUN 8 6 - 20 mg/dL   Creatinine, Ser 0.57 0.44 - 1.00 mg/dL   Calcium 8.7 (L) 8.9 - 10.3 mg/dL   GFR calc non Af Amer >60 >60 mL/min   GFR calc Af Amer >60 >60 mL/min   Anion gap 15 5 - 15    Comment: Performed at [place] [address], [place], [place] [address]  ACETAMINOPHEN LEVEL     Status: Abnormal   Collection Time: 06/14/19  2:42 AM  Result Value Ref Range   Acetaminophen (Tylenol), Serum <10 (L) 10 - 30 ug/mL    Comment: (NOTE) Therapeutic concentrations vary significantly. A range of 10-30 ug/mL  may be an effective concentration for many patients. However, some  are best treated at concentrations outside of this range. Acetaminophen concentrations >150 ug/mL at 4 hours after ingestion  and >50 ug/mL at 12 hours after ingestion are often associated with  toxic reactions. Performed at [facility], [place] [address], [place], [place] [id number]   Salicylate level     Status: None   Collection Time: 06/14/19  2:42 AM  Result Value Ref Range   Salicylate Lvl [id number] 2.8 - 30.0 mg/dL    Comment: Performed at [facility], [place] [address] [place], [place] [address]  Sedimentation rate     Status: None   Collection Time: 06/14/19  2:46 AM  Result Value Ref Range   Sed Rate 10 0 - 22 mm/hr    Comment: Performed at [facility], [place] [address] [place], [place] [address]  CBC with Differential/Platelet     Status: None   Collection Time: 06/14/19  3:30 AM  Result Value Ref Range   WBC 6.9 4.0 - 10.5 K/uL   RBC 4.76 3.87 - 5.11 MIL/uL   Hemoglobin 14.4 12.0 - 15.0 g/dL   HCT 43.5 36.0 - 46.0 %   MCV 91.4 80.0 - 100.0 fL   MCH 30.3 26.0 - 34.0 pg   MCHC 33.1 30.0 - 36.0 g/dL   RDW 13.6 11.5 - 15.5 %   Platelets 257 150 - 400 K/uL   nRBC 0.0 0.0 - 0.2 %   Neutrophils Relative % 44 %   Neutro Abs 3.1 1.7 - 7.7 K/uL   Lymphocytes Relative 44 %   Lymphs Abs 3.0 0.7 - 4.0 K/uL  Monocytes Relative 7 %   Monocytes Absolute 0.5 0.1 - 1.0 K/uL   Eosinophils Relative 2 %   Eosinophils Absolute 0.1 0.0 - 0.5 K/uL   Basophils Relative 2 %   Basophils Absolute 0.1 0.0 - 0.1 K/uL   Immature Granulocytes 1 %   Abs Immature Granulocytes 0.07 0.00 - 0.07 K/uL    Comment: Performed at [facility], [place] [address], [place], [place] [address]  HIV Antibody (routine testing w rflx)     Status: None   Collection Time: 06/14/19  3:30 AM  Result Value Ref Range   HIV Screen 4th Generation wRfx NON REACTIVE NON REACTIVE    Comment: Performed at [place] [address], [place], [place] [address]  Protime-INR     Status: Abnormal   Collection Time: 06/14/19  5:56 AM  Result Value Ref Range   Prothrombin Time 25.9 (H) 11.4 - 15.2 seconds   INR 2.4 (H) 0.8 - 1.2    Comment: (NOTE) INR goal varies based on device and disease states. Performed at [facility], [place] [address], [place], [place] [address]   Hepatitis panel, acute     Status: Abnormal   Collection Time: 06/14/19  5:56 AM  Result Value Ref Range   Hepatitis B Surface Ag NON REACTIVE NON REACTIVE   HCV Ab NON REACTIVE NON REACTIVE    Comment: (NOTE) Nonreactive HCV antibody screen is consistent with no HCV infections,  unless recent infection is suspected or other evidence exists to indicate HCV infection.    Hep A IgM  Reactive (A) NON REACTIVE   Hep B C IgM NON REACTIVE NON REACTIVE    Comment: Performed at [facility], [place] [address] [place], [place] [address]   DG Chest 2 View  Result Date: 06/13/2019 CLINICAL DATA:  Chest pain, fever, body aches. EXAM: CHEST - 2 VIEW COMPARISON:  10/24/2013 FINDINGS: Heart and mediastinal contours are within normal limits. No focal opacities or effusions. No acute bony abnormality. IMPRESSION: No active cardiopulmonary disease. Electronically Signed   By: Rolm Baptise M.D.   On: 06/13/2019 18:11   US Abdomen Limited  Result Date: 06/14/2019 CLINICAL DATA:  Elevated LFTs. EXAM: ULTRASOUND ABDOMEN LIMITED RIGHT UPPER QUADRANT COMPARISON:  None recent FINDINGS: Gallbladder: The gallbladder wall is thickened. The sonographic Percell Miller sign is negative. There is some pericholecystic free fluid. This may be mixed with areas of focal fatty sparing. There are no gallstones. The gallbladder is relatively under distended. Common bile duct: Diameter: 3 mm Liver: Diffuse increased echogenicity with slightly heterogeneous liver. Appearance typically secondary to fatty infiltration. Fibrosis secondary consideration. No secondary findings of cirrhosis noted. No focal hepatic lesion or intrahepatic biliary duct dilatation. Portal vein is patent on color Doppler imaging with normal direction of blood flow towards the liver. Other: None. IMPRESSION: 1. No sonographic evidence for cholelithiasis. 2. Mild gallbladder wall thickening and pericholecystic free fluid in the absence of a positive sonographic Murphy sign is non-specific. The gallbladder is relatively under distended which may account for the wall thickening. 3. Coarsened echogenic heterogeneous appearance of the liver parenchyma which is favored to be secondary to hepatic steatosis. 4. Probable areas of focal fatty sparing adjacent to the gallbladder fossa. Electronically Signed   By: Constance Holster M.D.   On: 06/14/2019 00:16     Pending Labs Unresulted Labs (From admission, onward)    Start     Ordered   06/14/19 0243  Culture, blood (  routine x 2)  BLOOD CULTURE X 2,   R (with STAT occurrences)     06/14/19 0242   06/14/19 0241  CBC WITH DIFFERENTIAL  Once,   STAT     06/14/19 0241          Vitals/Pain Today's Vitals   06/14/19 0930 06/14/19 1030 06/14/19 1047 06/14/19 1307  BP: 123/85 135/84 (!) 143/92 (!) 139/91  Pulse: 77 83  85  Resp: 19 (!) 21  19  Temp:      TempSrc:      SpO2: 98% 96%  97%  PainSc:        Isolation Precautions No active isolations  Medications Medications  amLODipine (NORVASC) tablet 5 mg (5 mg Oral Given 06/14/19 1047)  ALPRAZolam (XANAX) tablet 0.5 mg (has no administration in time range)  ondansetron (ZOFRAN) tablet 4 mg (has no administration in time range)    Or  ondansetron (ZOFRAN) injection 4 mg (has no administration in time range)  sodium chloride 0.9 % bolus 1,000 mL (0 mLs Intravenous Stopped 06/14/19 0241)  acetaminophen (TYLENOL) tablet 650 mg (650 mg Oral Given 06/13/19 2253)  lactated ringers bolus 1,000 mL (0 mLs Intravenous Stopped 06/14/19 0241)    Mobility walks Low fall risk   Focused Assessments     R Recommendations: See Admitting Provider Note  Report given to:   Additional Notes:

## 2019-06-14 NOTE — ED Notes (Signed)
Pt provided ice chips.

## 2019-06-14 NOTE — ED Notes (Signed)
Called lab to add labs on. 2 gold, and lavender, and a dark green needed.

## 2019-06-14 NOTE — Progress Notes (Signed)
PROGRESS NOTE  Olivia Meyer  DOB: Aug 17, 1965  PCP: [name], MD [id number]  DOA: 06/13/2019  LOS: 0 days   Chief Complaint  Patient presents with  . Hepatitis   Brief narrative: Patient is a 53 y.o. female with PMH of anxiety/depression, arthritis, hypertension, hyperlipidemia on herbal supplements. She presented to the ER on 06/13/2019 with progressive generalized weakness over a week. 12/10, she went to an urgent care and was noted to have a temperature of 102.  Covid test was sent which turned out negative on 12/12.  She continued to have symptoms and hence presented to the ED on 12/15.   In the ED, patient was afebrile. Labs showed elevated liver enzymes AST of 4100 and ALT of 5700.  Total bilirubin of 4.2 alkaline phosphatase 141 LDH of 2300. Right upper quadrant ultrasound showed no sonographic evidence for cholelithiasis, mild gallbladder wall thickening and pericholecystic free fluid, absent positive sonographic Murphy sign, gallbladder relatively underdistended.  It also showed coarsened echogenic heterogenous appearance of the liver parenchyma which is favored to be secondary to hepatic steatosis, probable areas of focal fatty sparing adjacent to the gallbladder fossa.  Patient was admitted to hospitalist service for acute liver failure.  GI consultation was called.  Patient works as an Pharmacist, hospital at [employer] and uses multiple herbs including Ashwaganda, Rhodiola and vitamins E, D and B complex, though she has not been taking these since she felt sick.  Also started on Effexor about 6 months ago for headaches by neurologist.  Subjective: Patient was seen and examined this morning. Pleasant middle-aged Caucasian female.  Not in distress.  No new symptoms.  Assessment/Plan: Acute liver injury:   Lab 06/13/19 1754 06/14/19 0239  AST 4,101* 4,448*  ALT 5,730* 5,949*  ALKPHOS 141* 133*  BILITOT 4.2* 4.1*  PROT 7.3 7.4  ALBUMIN 3.7 3.7     Lab 06/13/19 2234  06/14/19 0556  INR 1.8* 2.4*   -Possible etiologies: Viral hepatitis, alcoholic liver disease, liver injury due to herbal supplements. -GI consultation appreciated. -Sent for acute hepatitis panel, CMV, HSV, EBV  Hypertension -monitor on amlodipine.  h/o anxiety and depression -will hold Effexor. As needed Xanax for anxiety. May have to hold if further worsening LFTs.   Mobility: Encourage ambulation Diet: Cardiac diet Fluid: None DVT prophylaxis:  SCDs Code Status:  Full code Family Communication:  None Expected Discharge:  Continue inpatient management  Consultants:  GI  Procedures:    Antimicrobials: Anti-infectives (From admission, onward)   None        Code Status: Full Code   Diet Order            Diet Heart Room service appropriate? Yes; Fluid consistency: Thin  Diet effective now              Infusions:    Scheduled Meds: . amLODipine  5 mg Oral Daily    PRN meds: ALPRAZolam, ondansetron **OR** ondansetron (ZOFRAN) IV   Objective: Vitals:   06/14/19 1307 06/14/19 1428  BP: (!) 139/91 (!) 163/103  Pulse: 85 80  Resp: 19 18  Temp:  98.3 F (36.8 C)  SpO2: 97% 99%    Intake/Output Summary (Last 24 hours) at 06/14/2019 1559 Last data filed at 06/14/2019 0241 Gross per 24 hour  Intake 2000 ml  Output --  Net 2000 ml   There were no vitals filed for this visit. Weight change:  There is no height or weight on file to calculate BMI.   Physical Exam:  General exam: Appears calm and comfortable.  Skin: No rashes, lesions or ulcers. HEENT: Atraumatic, normocephalic, supple neck, no obvious bleeding Lungs: Clear to auscultation bilaterally CVS: Regular rate and rhythm, no murmur GI/Abd: Soft, nontender, nondistended, bowel sound present CNS: Alert, awake, oriented x3 Psychiatry: Mood appropriate Extremities: No pedal edema, no calf tenderness  Data Review: I have personally reviewed the laboratory data and studies available.  Recent  Labs  Lab 06/13/19 1754 06/14/19 0330  WBC 7.1 6.9  NEUTROABS 3.9 3.1  HGB 17.0* 14.4  HCT 50.8* 43.5  MCV 90.2 91.4  PLT 270 257   Recent Labs  Lab 06/13/19 1754 06/14/19 0239  NA 132* 133*  K 3.8 4.1  CL 93* 94*  CO2 27 24  GLUCOSE 115* 90  BUN 8 8  CREATININE 0.59 0.57  CALCIUM 8.7* 8.7*   Recent Labs  Lab 06/13/19 1754 06/14/19 0239  AST 4,101* 4,448*  ALT 5,730* 5,949*  ALKPHOS 141* 133*  BILITOT 4.2* 4.1*  PROT 7.3 7.4  ALBUMIN 3.7 3.7    Terrilee Croak, MD  [facility] 06/14/2019

## 2019-06-14 NOTE — H&P (Signed)
History and Physical    Olivia Meyer [id number] DOB: 17-Mar-1966 DOA: 06/13/2019  PCP: [name], MD  Patient coming from: Home.  Chief Complaint: Weakness.  HPI: Olivia Meyer is a 53 y.o. female with history of hypertension, depression has been experiencing fatigue and weakness over the last 1 week.  Patient states he has been feeling weak and tired denies any nausea vomiting diarrhea chest pain or shortness of breath.  Has noticed some abdominal distention.  Denies any abdominal pain.  Patient states he drinks alcohol every day but has not had any alcohol for the last 10 days since patient was feeling weak.  ED Course: In the ER patient was afebrile.  Icteric in appearance.  Labs show markedly elevated AST of 4100 and ALT of 5700.  Total bilirubin of 4.2 alkaline phosphatase 141 LDH of 2300.  COVID-19 test was negative.  INR was 1.8 albumin was 3.7.  Patient denies taking any Tylenol recently in the last 1 month.  Patient was started on Effexor for depression in July 2020 about 6 months ago.  Patient also has been taking 2 herbal medications Lasix months.  On exam patient abdomen appears benign.  Afebrile.  Dr. Carlean Purl on-call gastroenterology was consulted by ER physician and patient admitted for further management.  Review of Systems: As per HPI, rest all negative.   Past Medical History:  Diagnosis Date  . Anxiety   . Arthritis    RIGHT HIP OA AND PAIN  . Cancer (HCC)    BASAL CANCER REMOVED LEFT UPPER LIP  . Depression   . Expected blood loss anemia 11/01/2013  . History of chicken pox   . Hypertension   . Numbness    RIGHT LEG NUMBNESS SINCE NOV 2014 - HAS HAD ULTRASOUND - NO BLOOD CLOT     Past Surgical History:  Procedure Laterality Date  . 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 APPENDECTOMY  2001  . BASAL CELL CARCINOMA EXCISION  2008  . BREAST SURGERY  1998   BREAST AUGMENTATION  . [place]  . HIP ARTHROSCOPY Right  2013  . MOHS SURGERY  2014   LEFT UPPER LIP  . TOTAL HIP ARTHROPLASTY Right 10/31/2013   Procedure: RIGHT TOTAL HIP ARTHROPLASTY ANTERIOR APPROACH;  Surgeon: Mauri Pole, MD;  Location: [facility];  Service: Orthopedics;  Laterality: Right;  . TUBAL LIGATION  1999     reports that she quit smoking about 33 years ago. She has never used smokeless tobacco. She reports current alcohol use. She reports that she does not use drugs.  Allergies  Allergen Reactions  . Ace Inhibitors     COUGH AND THROAT IRRITATION  . Morphine And Related     ITCHING AND SKIN BLOTCHES  . Prednisone     makers her 'crazy'    Family History  Problem Relation Age of Onset  . Kidney failure Father   . Non-Hodgkin's lymphoma Father   . Cervical cancer Mother   . Melanoma Mother   . Hypertension Mother   . Irritable bowel syndrome Mother   . Dementia Mother   . Vascular Disease Maternal Grandmother   . Stomach cancer Maternal Grandfather   . ALS Paternal Grandfather   . ALS Paternal Aunt   . ALS Cousin     Prior to Admission medications   Medication Sig Start Date End Date Taking? Authorizing Provider  ALPRAZolam Duanne Moron) 0.5 MG tablet Take  1 tablet (0.5 mg total) by mouth 2 (two) times daily as needed. for anxiety 04/18/19  Yes [name], MD  amLODipine (NORVASC) 5 MG tablet Take 5 mg by mouth daily.   Yes [provider]  ASHWAGANDHA PO Take 1 tablet by mouth daily.    Yes [provider]  B COMPLEX VITAMINS PO Take 1 tablet by mouth daily. 02/03/10  Yes [provider]  Cholecalciferol (VITAMIN D-3 PO) Take 1 capsule by mouth daily.    Yes [provider]  Omega-3 Fatty Acids (FISH OIL) 1000 MG CAPS Take 1 capsule by mouth daily. 01/31/09  Yes [provider]  Rhodiola rosea (RHODIOLA PO) Take 1 capsule by mouth daily.    Yes [provider]  venlafaxine XR (EFFEXOR-XR) 75 MG 24 hr capsule Take 1 capsule (75 mg total) by mouth daily with breakfast.  12/06/18  Yes Marcial Pacas, MD  VITAMIN E PO Take 1 capsule by mouth daily.  01/31/09  Yes [provider]  atorvastatin (LIPITOR) 10 MG tablet Take 1 tablet (10 mg total) by mouth daily. Patient not taking: Reported on 06/13/2019 05/30/19   [name], MD    Physical Exam: Constitutional: Moderately built and nourished. Vitals:   06/13/19 2200 06/13/19 2215 06/14/19 0033 06/14/19 0230  BP: 126/86  (!) 145/96 117/80  Pulse: 77 85 75 70  Resp: 17 12 17    Temp:      TempSrc:      SpO2: 92% 96% 97% 92%   Eyes: Mild icterus no pallor. ENMT: No discharge from the ears eyes nose or mouth. Neck: No mass felt.  No neck rigidity. Respiratory: No rhonchi or crepitations. Cardiovascular: S1-S2 heard. Abdomen: Soft nontender bowel sound present. Musculoskeletal: No edema. Skin: No rash. Neurologic: Alert awake oriented to time place and person.  Moves all extremities. Psychiatric: Appears normal per normal affect.   Labs on Admission: I have personally reviewed following labs and imaging studies  CBC: Recent Labs  Lab 06/13/19 1754  WBC 7.1  NEUTROABS 3.9  HGB 17.0*  HCT 50.8*  MCV 90.2  PLT [id number]   Basic Metabolic Panel: Recent Labs  Lab 06/13/19 1754  NA 132*  K 3.8  CL 93*  CO2 27  GLUCOSE 115*  BUN 8  CREATININE 0.59  CALCIUM 8.7*   GFR: CrCl cannot be calculated (Unknown ideal weight.). Liver Function Tests: Recent Labs  Lab 06/13/19 1754  AST 4,101*  ALT 5,730*  ALKPHOS 141*  BILITOT 4.2*  PROT 7.3  ALBUMIN 3.7   No results for input(s): LIPASE, AMYLASE in the last 168 hours. No results for input(s): AMMONIA in the last 168 hours. Coagulation Profile: Recent Labs  Lab 06/13/19 2234  INR 1.8*   Cardiac Enzymes: No results for input(s): CKTOTAL, CKMB, CKMBINDEX, TROPONINI in the last 168 hours. BNP (last 3 results) No results for input(s): PROBNP in the last 8760 hours. HbA1C: No results for input(s): HGBA1C in the last 72  hours. CBG: No results for input(s): GLUCAP in the last 168 hours. Lipid Profile: No results for input(s): CHOL, HDL, LDLCALC, TRIG, CHOLHDL, LDLDIRECT in the last 72 hours. Thyroid Function Tests: No results for input(s): TSH, T4TOTAL, FREET4, T3FREE, THYROIDAB in the last 72 hours. Anemia Panel: Recent Labs    06/13/19 1754  RETICCTPCT 0.9   Urine analysis:    Component Value Date/Time   COLORURINE AMBER (A) 06/14/2019 0033   APPEARANCEUR CLEAR 06/14/2019 0033   LABSPEC 1.006 06/14/2019 0033  PHURINE 6.0 06/14/2019 0033   GLUCOSEU NEGATIVE 06/14/2019 0033   HGBUR NEGATIVE 06/14/2019 0033   BILIRUBINUR NEGATIVE 06/14/2019 0033   KETONESUR NEGATIVE 06/14/2019 0033   PROTEINUR NEGATIVE 06/14/2019 0033   UROBILINOGEN 0.2 10/24/2013 1030   NITRITE NEGATIVE 06/14/2019 0033   LEUKOCYTESUR NEGATIVE 06/14/2019 0033   Sepsis Labs: @LABRCNTIP (procalcitonin:4,lacticidven:4) )No results found for this or any previous visit (from the past 240 hour(s)).   Radiological Exams on Admission: DG Chest 2 View  Result Date: 06/13/2019 CLINICAL DATA:  Chest pain, fever, body aches. EXAM: CHEST - 2 VIEW COMPARISON:  10/24/2013 FINDINGS: Heart and mediastinal contours are within normal limits. No focal opacities or effusions. No acute bony abnormality. IMPRESSION: No active cardiopulmonary disease. Electronically Signed   By: Rolm Baptise M.D.   On: 06/13/2019 18:11   US Abdomen Limited  Result Date: 06/14/2019 CLINICAL DATA:  Elevated LFTs. EXAM: ULTRASOUND ABDOMEN LIMITED RIGHT UPPER QUADRANT COMPARISON:  None recent FINDINGS: Gallbladder: The gallbladder wall is thickened. The sonographic Percell Miller sign is negative. There is some pericholecystic free fluid. This may be mixed with areas of focal fatty sparing. There are no gallstones. The gallbladder is relatively under distended. Common bile duct: Diameter: 3 mm Liver: Diffuse increased echogenicity with slightly heterogeneous liver. Appearance  typically secondary to fatty infiltration. Fibrosis secondary consideration. No secondary findings of cirrhosis noted. No focal hepatic lesion or intrahepatic biliary duct dilatation. Portal vein is patent on color Doppler imaging with normal direction of blood flow towards the liver. Other: None. IMPRESSION: 1. No sonographic evidence for cholelithiasis. 2. Mild gallbladder wall thickening and pericholecystic free fluid in the absence of a positive sonographic Murphy sign is non-specific. The gallbladder is relatively under distended which may account for the wall thickening. 3. Coarsened echogenic heterogeneous appearance of the liver parenchyma which is favored to be secondary to hepatic steatosis. 4. Probable areas of focal fatty sparing adjacent to the gallbladder fossa. Electronically Signed   By: Constance Holster M.D.   On: 06/14/2019 00:16    EKG: Independently reviewed.  Normal sinus rhythm.  Assessment/Plan Principal Problem:   Acute hepatitis Active Problems:   Essential (primary) hypertension    1. Acute hepatitis/acute liver failure -patient denies taking any Tylenol.  Dr. Carlean Purl has been consulted.  The only 3 new medication patient has been taking recently was to herbal medication and Effexor.  We will hold off all these.  Check acute hepatitis panel Tylenol level follow LFTs sonogram abdomen is nonspecific.  Patient's medication list states patient is on statin but patient states she has never taken it. 2. Hypertension on amlodipine. 3. History of anxiety and depression will hold Effexor.  As needed Xanax for anxiety.  May have to hold if further worsening LFTs.   DVT prophylaxis: SCDs due to elevated LFTs. Code Status: Full code. Family Communication: Discussed with patient. Disposition Plan: Home. Consults called: Dr. Carlean Purl was consulted by the ER physician. Admission status: Observation.   Rise Patience MD [facility] Pager [phone].  If 7PM-7AM,  please contact night-coverage [email or web address] Password [email or web address]  06/14/2019, 2:42 AM

## 2019-06-14 NOTE — ED Notes (Signed)
Pt able to stand at bedside and use female urinal with no assist. Pt back in bed at this time and denies needing anything. Pt has call bell and phone within reach.

## 2019-06-14 NOTE — Consult Note (Signed)
Consultation  Referring Provider: Dr. Hal Hope    Primary Care Physician:  [name], MD Primary Gastroenterologist: Althia Forts         Reason for Consultation:  Elevated LFTs            HPI:   Olivia Meyer is a 53 y.o. female with a past medical history as listed below, who presented to the ER on 06/13/2019 with a weakness.  ED course significant for significantly elevated LFTs for which we are consulted.    Today, the patient explains that for the past week or so she has been feeling fatigued and weak, noting that her stamina at work had been declining, so much so that she left work early one day.  Explains that she then went to the urgent care as she was running a fever around 102 and got Covid and flu tested on 06/08/2019, these tests returned negative on the 12th but she continued to feel very fatigued and weak, called her PCP who wanted to give her antibiotic for suspected sinusitis but instead the patient brought herself to the hospital.   Does admit that she uses multiple herbs including Ashwaganda, Rhodiola and vitamins E, D and B complex, though she has not been taking these since she felt sick.  Also started on Effexor about 6 months ago for headaches by neurologist.    Social history positive for drinking alcohol on most days, tells [place] that when she is not working she may have 3 gin and tonics or drink a half a bottle of wine.  Works as a Marketing executive at [employer].    Denies family history of liver problems, previous liver problems or hepatitis.     ED course: Icteric, labs with AST of 4101--> 4448, ALT of 5730--> 5949, total bili of 4.2-->4.1, alk phos 141-->133, COVID-19 negative, INR 1.8, albumin 3.7.  Reported being started on Effexor for depression in July 20 26 months ago, also has been taking to herbal medications over the past month; right upper quadrant ultrasound with no sonographic evidence for cholelithiasis, mild gallbladder wall thickening and pericholecystic  free fluid in the absence of positive sonographic Murphy sign is nonspecific, gallbladder relatively underdistended, coarsened echogenic heterogenous appearance of the liver parenchyma which is favored to be secondary to hepatic steatosis, probable areas of focal fatty sparing adjacent to the gallbladder fossa  Past Medical History:  Diagnosis Date  . Anxiety   . Arthritis    RIGHT HIP OA AND PAIN  . Cancer (HCC)    BASAL CANCER REMOVED LEFT UPPER LIP  . Depression   . Expected blood loss anemia 11/01/2013  . History of chicken pox   . Hypertension   . Numbness    RIGHT LEG NUMBNESS SINCE NOV 2014 - HAS HAD ULTRASOUND - NO BLOOD CLOT     Past Surgical History:  Procedure Laterality Date  . 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 APPENDECTOMY  2001  . BASAL CELL CARCINOMA EXCISION  2008  . BREAST SURGERY  1998   BREAST AUGMENTATION  . [place]  . HIP ARTHROSCOPY Right 2013  . MOHS SURGERY  2014   LEFT UPPER LIP  . TOTAL HIP ARTHROPLASTY Right 10/31/2013   Procedure: RIGHT TOTAL HIP ARTHROPLASTY ANTERIOR APPROACH;  Surgeon: Mauri Pole, MD;  Location: [facility];  Service: Orthopedics;  Laterality: Right;  . TUBAL LIGATION  1999    Family History  Problem  Relation Age of Onset  . Kidney failure Father   . Non-Hodgkin's lymphoma Father   . Cervical cancer Mother   . Melanoma Mother   . Hypertension Mother   . Irritable bowel syndrome Mother   . Dementia Mother   . Vascular Disease Maternal Grandmother   . Stomach cancer Maternal Grandfather   . ALS Paternal Grandfather   . ALS Paternal Aunt   . ALS Cousin     Social History   Tobacco Use  . Smoking status: Former Smoker    Quit date: 06/29/1985    Years since quitting: 33.9  . Smokeless tobacco: Never Used  Substance Use Topics  . Alcohol use: Yes    Alcohol/week: 0.0 standard drinks    Comment:   WINE ON WEEK ENDS ( MAYBE 3-4 GLASSES A WEEK )  . Drug use: No    Prior to  Admission medications   Medication Sig Start Date End Date Taking? Authorizing Provider  ALPRAZolam Duanne Moron) 0.5 MG tablet Take 1 tablet (0.5 mg total) by mouth 2 (two) times daily as needed. for anxiety 04/18/19  Yes [name], MD  amLODipine (NORVASC) 5 MG tablet Take 5 mg by mouth daily.   Yes [provider]  ASHWAGANDHA PO Take 1 tablet by mouth daily.    Yes [provider]  B COMPLEX VITAMINS PO Take 1 tablet by mouth daily. 02/03/10  Yes [provider]  Cholecalciferol (VITAMIN D-3 PO) Take 1 capsule by mouth daily.    Yes [provider]  Omega-3 Fatty Acids (FISH OIL) 1000 MG CAPS Take 1 capsule by mouth daily. 01/31/09  Yes [provider]  Rhodiola rosea (RHODIOLA PO) Take 1 capsule by mouth daily.    Yes [provider]  venlafaxine XR (EFFEXOR-XR) 75 MG 24 hr capsule Take 1 capsule (75 mg total) by mouth daily with breakfast. 12/06/18  Yes Marcial Pacas, MD  VITAMIN E PO Take 1 capsule by mouth daily.  01/31/09  Yes [provider]  atorvastatin (LIPITOR) 10 MG tablet Take 1 tablet (10 mg total) by mouth daily. Patient not taking: Reported on 06/13/2019 05/30/19   [name], MD    Current Facility-Administered Medications  Medication Dose Route Frequency Provider Last Rate Last Admin  . ALPRAZolam Duanne Moron) tablet 0.5 mg  0.5 mg Oral BID PRN Rise Patience, MD      . amLODipine (NORVASC) tablet 5 mg  5 mg Oral Daily Rise Patience, MD   5 mg at 06/14/19 1047  . ondansetron (ZOFRAN) tablet 4 mg  4 mg Oral Q6H PRN Rise Patience, MD       Or  . ondansetron [facility]) injection 4 mg  4 mg Intravenous Q6H PRN Rise Patience, MD       Current Outpatient Medications  Medication Sig Dispense Refill  . ALPRAZolam (XANAX) 0.5 MG tablet Take 1 tablet (0.5 mg total) by mouth 2 (two) times daily as needed. for anxiety 60 tablet 2  . amLODipine (NORVASC) 5 MG tablet Take 5 mg by mouth daily.    .  ASHWAGANDHA PO Take 1 tablet by mouth daily.     . B COMPLEX VITAMINS PO Take 1 tablet by mouth daily.    . Cholecalciferol (VITAMIN D-3 PO) Take 1 capsule by mouth daily.     . Omega-3 Fatty Acids (FISH OIL) 1000 MG CAPS Take 1 capsule by mouth daily.    . Rhodiola rosea (RHODIOLA PO) Take 1 capsule  by mouth daily.     Marland Kitchen venlafaxine XR (EFFEXOR-XR) 75 MG 24 hr capsule Take 1 capsule (75 mg total) by mouth daily with breakfast. 90 capsule 4  . VITAMIN E PO Take 1 capsule by mouth daily.     Marland Kitchen atorvastatin (LIPITOR) 10 MG tablet Take 1 tablet (10 mg total) by mouth daily. (Patient not taking: Reported on 06/13/2019) 30 tablet 1    Allergies as of 06/13/2019 - Review Complete 06/13/2019  Allergen Reaction Noted  . Ace inhibitors  10/24/2013  . Morphine and related  10/24/2013  . Prednisone  02/05/2015     Review of Systems:    Constitutional: No weight loss, fever or chills Skin: No rash Cardiovascular: No chest pain  Respiratory: No SOB  Gastrointestinal: See HPI and otherwise negative Genitourinary: No dysuria  Neurological: No dizziness or syncope Musculoskeletal: No new muscle or joint pain Hematologic: No bleeding  Psychiatric: No history of depression or anxiety    Physical Exam:  Vital signs in last 24 hours: Temp:  [99 F (37.2 C)] 99 F (37.2 C) (12/15 1730) Pulse Rate:  [63-90] 83 (12/16 1030) Resp:  [12-21] 21 (12/16 1030) BP: (116-150)/(80-98) 143/92 (12/16 1047) SpO2:  [92 %-100 %] 96 % (12/16 1030)   General:   Pleasant Caucasian female appears to be in NAD, Well developed, Well nourished, alert and cooperative Head:  Normocephalic and atraumatic. Eyes:   PEERL, EOMI. Icteric. Conjunctiva pink. Ears:  Normal auditory acuity. Neck:  Supple Throat: Oral cavity and pharynx without inflammation, swelling or lesion.  Lungs: Respirations even and unlabored. Lungs clear to auscultation bilaterally.   No wheezes, crackles, or rhonchi.  Heart: Normal S1, S2. No MRG.  Regular rate and rhythm. No peripheral edema, cyanosis or pallor.  Abdomen:  Soft, nondistended, nontender. No rebound or guarding. Normal bowel sounds. No appreciable masses or hepatomegaly. Rectal:  Not performed.  Msk:  Symmetrical without gross deformities. Peripheral pulses intact.  Extremities:  Without edema, no deformity or joint abnormality.  Neurologic:  Alert and  oriented x4;  grossly normal neurologically.  Skin:   Dry and intact without significant lesions or rashes. Psychiatric: Demonstrates good judgement and reason without abnormal affect or behaviors.   LAB RESULTS: Recent Labs    06/13/19 1754 06/14/19 0330  WBC 7.1 6.9  HGB 17.0* 14.4  HCT 50.8* 43.5  PLT 270 257   BMET Recent Labs    06/13/19 1754 06/14/19 0239  NA 132* 133*  K 3.8 4.1  CL 93* 94*  CO2 27 24  GLUCOSE 115* 90  BUN 8 8  CREATININE 0.59 0.57  CALCIUM 8.7* 8.7*   LFT Recent Labs    06/14/19 0239  PROT 7.4  ALBUMIN 3.7  AST 4,448*  ALT 5,949*  ALKPHOS 133*  BILITOT 4.1*  BILIDIR 2.0*  IBILI 2.1*   PT/INR Recent Labs    06/13/19 2234 06/14/19 0556  LABPROT 20.5* 25.9*  INR 1.8* 2.4*    STUDIES: DG Chest 2 View  Result Date: 06/13/2019 CLINICAL DATA:  Chest pain, fever, body aches. EXAM: CHEST - 2 VIEW COMPARISON:  10/24/2013 FINDINGS: Heart and mediastinal contours are within normal limits. No focal opacities or effusions. No acute bony abnormality. IMPRESSION: No active cardiopulmonary disease. Electronically Signed   By: Rolm Baptise M.D.   On: 06/13/2019 18:11   US Abdomen Limited  Result Date: 06/14/2019 CLINICAL DATA:  Elevated LFTs. EXAM: ULTRASOUND ABDOMEN LIMITED RIGHT UPPER QUADRANT COMPARISON:  None recent FINDINGS: Gallbladder: The gallbladder  wall is thickened. The sonographic Percell Miller sign is negative. There is some pericholecystic free fluid. This may be mixed with areas of focal fatty sparing. There are no gallstones. The gallbladder is relatively under  distended. Common bile duct: Diameter: 3 mm Liver: Diffuse increased echogenicity with slightly heterogeneous liver. Appearance typically secondary to fatty infiltration. Fibrosis secondary consideration. No secondary findings of cirrhosis noted. No focal hepatic lesion or intrahepatic biliary duct dilatation. Portal vein is patent on color Doppler imaging with normal direction of blood flow towards the liver. Other: None. IMPRESSION: 1. No sonographic evidence for cholelithiasis. 2. Mild gallbladder wall thickening and pericholecystic free fluid in the absence of a positive sonographic Murphy sign is non-specific. The gallbladder is relatively under distended which may account for the wall thickening. 3. Coarsened echogenic heterogeneous appearance of the liver parenchyma which is favored to be secondary to hepatic steatosis. 4. Probable areas of focal fatty sparing adjacent to the gallbladder fossa. Electronically Signed   By: Constance Holster M.D.   On: 06/14/2019 00:16     Impression / Plan:   Impression: 1.  Elevated LFTs: AST 4101--> 4448; ALT 5730--> 5949, total bili of 4.2--> 4.1, alk phos 141--> 133, INR 1.8, history of alcohol abuse, ultrasound with no sonographic evidence of cholelithiasis, no real abdominal pain; concern for viral hepatitis versus alcohol induced hepatitis versus other  Plan: 1.  Ordered additional labs including CMV, HSV, EBV and hep B 2.  Please await any further recommendations from Dr. Bryan Lemma later today.  Thank you for your kind consultation, we will continue to follow.  Lavone Nian [facility]  06/14/2019, 12:49 PM

## 2019-06-14 NOTE — ED Notes (Signed)
Pt provided breakfast tray, updated on plan of care. Pt aware she can now use the restroom down the hall.

## 2019-06-14 NOTE — ED Notes (Signed)
Pt states that she drinks occasionally and will have a few glasses of wine when out, but thinks that alcohol started affecting her more after she started taking effexor. Pt alert and oriented

## 2019-06-15 DIAGNOSIS — B179 Acute viral hepatitis, unspecified: Secondary | ICD-10-CM | POA: Diagnosis not present

## 2019-06-15 DIAGNOSIS — D689 Coagulation defect, unspecified: Secondary | ICD-10-CM | POA: Diagnosis not present

## 2019-06-15 LAB — PROTIME-INR
INR: 1.4 — ABNORMAL HIGH (ref 0.8–1.2)
Prothrombin Time: 17 seconds — ABNORMAL HIGH (ref 11.4–15.2)

## 2019-06-15 LAB — CBC WITH DIFFERENTIAL/PLATELET
Abs Immature Granulocytes: 0.05 10*3/uL (ref 0.00–0.07)
Basophils Absolute: 0.2 10*3/uL — ABNORMAL HIGH (ref 0.0–0.1)
Basophils Relative: 2 %
Eosinophils Absolute: 0.1 10*3/uL (ref 0.0–0.5)
Eosinophils Relative: 1 %
HCT: 46 % (ref 36.0–46.0)
Hemoglobin: 15.3 g/dL — ABNORMAL HIGH (ref 12.0–15.0)
Immature Granulocytes: 1 %
Lymphocytes Relative: 55 %
Lymphs Abs: 5.5 10*3/uL — ABNORMAL HIGH (ref 0.7–4.0)
MCH: 30.4 pg (ref 26.0–34.0)
MCHC: 33.3 g/dL (ref 30.0–36.0)
MCV: 91.5 fL (ref 80.0–100.0)
Monocytes Absolute: 0.7 10*3/uL (ref 0.1–1.0)
Monocytes Relative: 7 %
Neutro Abs: 3.3 10*3/uL (ref 1.7–7.7)
Neutrophils Relative %: 34 %
Platelets: 320 10*3/uL (ref 150–400)
RBC: 5.03 MIL/uL (ref 3.87–5.11)
RDW: 14 % (ref 11.5–15.5)
WBC: 9.8 10*3/uL (ref 4.0–10.5)
nRBC: 0 % (ref 0.0–0.2)

## 2019-06-15 LAB — COMPREHENSIVE METABOLIC PANEL
ALT: 4030 U/L — ABNORMAL HIGH (ref 0–44)
AST: 2141 U/L — ABNORMAL HIGH (ref 15–41)
Albumin: 3.4 g/dL — ABNORMAL LOW (ref 3.5–5.0)
Alkaline Phosphatase: 149 U/L — ABNORMAL HIGH (ref 38–126)
Anion gap: 12 (ref 5–15)
BUN: 7 mg/dL (ref 6–20)
CO2: 27 mmol/L (ref 22–32)
Calcium: 8.5 mg/dL — ABNORMAL LOW (ref 8.9–10.3)
Chloride: 97 mmol/L — ABNORMAL LOW (ref 98–111)
Creatinine, Ser: 0.47 mg/dL (ref 0.44–1.00)
GFR calc Af Amer: >60 mL/min (ref >60)
GFR calc non Af Amer: >60 mL/min (ref >60)
Glucose, Bld: 100 mg/dL — ABNORMAL HIGH (ref 70–99)
Potassium: 3.8 mmol/L (ref 3.5–5.1)
Sodium: 136 mmol/L (ref 135–145)
Total Bilirubin: 6.5 mg/dL — ABNORMAL HIGH (ref 0.3–1.2)
Total Protein: 6.8 g/dL (ref 6.5–8.1)

## 2019-06-15 NOTE — Discharge Summary (Signed)
Physician Discharge Summary  Olivia Meyer [id number] DOB: 10-18-1965 DOA: 06/13/2019  PCP: [name], MD  Admit date: 06/13/2019 Discharge date: 06/15/2019   Admitted From: Home Discharge disposition: Home   Code Status: Full Code  Diet Recommendation: Heart healthy diet   Recommendations for Outpatient Follow-Up:   1. Follow-up with PCP in 1 week to repeat LFTs. 2. Follow-up with GI in 2 to 3 weeks.  Discharge Diagnosis:   Principal Problem:   Acute hepatitis Active Problems:   Essential (primary) hypertension   Coagulopathy (HCC)   Hyperbilirubinemia   History of Present Illness / Brief narrative:  Patientis a 53 y.o.femalewith PMH of anxiety/depression, arthritis, hypertension, hyperlipidemia on herbal supplements. She presented to the ER on 06/13/2019 with progressive generalized weakness over a week. 12/10, she went to an urgent care and was noted to have a temperature of 102.  Covid test was sent which turned out negative on 12/12.  She continued to have symptoms and hence presented to the ED on 12/15.    In the ED, patient was afebrile. Labs showed elevated liver enzymes AST of 4100 and ALT of 5700. Total bilirubin of 4.2 alkaline phosphatase 141 LDH of 2300. Right upper quadrant ultrasound showed no sonographic evidence for cholelithiasis, mild gallbladder wall thickening and pericholecystic free fluid, absent positive sonographic Murphy sign, gallbladder relatively underdistended.  It also showed coarsened echogenic heterogenous appearance of the liver parenchyma which is favored to be secondary to hepatic steatosis, probable areas of focal fatty sparing adjacent to the gallbladder fossa.  Patient was admitted to hospitalist service for acute liver failure.  GI consultation was called.  Patient works as an Pharmacist, hospital at [employer] and uses multiple herbs including Ashwaganda, Rhodiola and vitamins E,D and B complex, though she has not been taking  these since she felt sick. Also started on Effexor about 6 months ago for headaches by neurologist.  Hospital Course:  Acute liver injury Acute Hepatitis A -Liver enzymes were elevated on admission, subsequently trended down. -Acute hepatitis panel showed IgM positive for hepatitis A. -GI consultation appreciated. -Okay to discharge home today to repeat LFT in a week and to follow-up with GI in next 2 to 3 weeks.  Hypertension -continue amlodipine.  h/o anxiety and depression -on Effexor and Xanax  Hyperlipidemia/hepatic steatosis -patient refuses to be on a statin.  States that she will continue natural methods.  Stable for discharge to home today.  Subjective:  Seen and examined this morning.  Pleasant middle-aged Caucasian female.  Not in distress.  No new symptoms.  Discharge Exam:   Vitals:   06/14/19 1428 06/14/19 2008 06/15/19 0600 06/15/19 1014  BP: (!) 163/103 (!) 146/95 (!) 135/95 (!) 136/101  Pulse: 80 79 72 74  Resp: 18 18 18    Temp: 98.3 F (36.8 C) 100 F (37.8 C) 98.7 F (37.1 C)   TempSrc: Oral Oral Oral   SpO2: 99% 97% 98% 98%  Weight:   74.5 kg     Body mass index is 29.09 kg/m.  General exam: Appears calm and comfortable.  Skin: No rashes, lesions or ulcers. HEENT: Atraumatic, normocephalic, supple neck, no obvious bleeding Lungs: Clear to auscultation bilaterally CVS: Regular rate and rhythm, no murmur GI/Abd soft, nontender, nondistended, bowel sound present CNS: Alert, awake, oriented x3 Psychiatry: Mood appropriate Extremities: No pedal edema, no calf tenderness  Discharge Instructions:  Wound care: None Discharge Instructions    Increase activity slowly   Complete by: As directed  Follow-up Information    Fisher, Kirstie Peri, MD Follow up.   Specialty: Family Medicine Why: Repeat LFT in a week Contact information: [place] [address] [phone]          Allergies as of 06/15/2019       Reactions   Ace Inhibitors    COUGH AND THROAT IRRITATION   Morphine And Related    ITCHING AND SKIN BLOTCHES   Prednisone    makers her 'crazy'      Medication List    TAKE these medications   ALPRAZolam 0.5 MG tablet Commonly known as: XANAX Take 1 tablet (0.5 mg total) by mouth 2 (two) times daily as needed. for anxiety   amLODipine 5 MG tablet Commonly known as: NORVASC Take 5 mg by mouth daily.   ASHWAGANDHA PO Take 1 tablet by mouth daily.   atorvastatin 10 MG tablet Commonly known as: LIPITOR Take 1 tablet (10 mg total) by mouth daily.   B COMPLEX VITAMINS PO Take 1 tablet by mouth daily.   Fish Oil 1000 MG Caps Take 1 capsule by mouth daily.   RHODIOLA PO Take 1 capsule by mouth daily.   venlafaxine XR 75 MG 24 hr capsule Commonly known as: Effexor XR Take 1 capsule (75 mg total) by mouth daily with breakfast.   VITAMIN D-3 PO Take 1 capsule by mouth daily.   VITAMIN E PO Take 1 capsule by mouth daily.       Time coordinating discharge: 35 minutes  The results of significant diagnostics from this hospitalization (including imaging, microbiology, ancillary and laboratory) are listed below for reference.    Procedures and Diagnostic Studies:   DG Chest 2 View  Result Date: 06/13/2019 CLINICAL DATA:  Chest pain, fever, body aches. EXAM: CHEST - 2 VIEW COMPARISON:  10/24/2013 FINDINGS: Heart and mediastinal contours are within normal limits. No focal opacities or effusions. No acute bony abnormality. IMPRESSION: No active cardiopulmonary disease. Electronically Signed   By: Rolm Baptise M.D.   On: 06/13/2019 18:11   US Abdomen Limited  Result Date: 06/14/2019 CLINICAL DATA:  Elevated LFTs. EXAM: ULTRASOUND ABDOMEN LIMITED RIGHT UPPER QUADRANT COMPARISON:  None recent FINDINGS: Gallbladder: The gallbladder wall is thickened. The sonographic Percell Miller sign is negative. There is some pericholecystic free fluid. This may be mixed with areas of focal fatty  sparing. There are no gallstones. The gallbladder is relatively under distended. Common bile duct: Diameter: 3 mm Liver: Diffuse increased echogenicity with slightly heterogeneous liver. Appearance typically secondary to fatty infiltration. Fibrosis secondary consideration. No secondary findings of cirrhosis noted. No focal hepatic lesion or intrahepatic biliary duct dilatation. Portal vein is patent on color Doppler imaging with normal direction of blood flow towards the liver. Other: None. IMPRESSION: 1. No sonographic evidence for cholelithiasis. 2. Mild gallbladder wall thickening and pericholecystic free fluid in the absence of a positive sonographic Murphy sign is non-specific. The gallbladder is relatively under distended which may account for the wall thickening. 3. Coarsened echogenic heterogeneous appearance of the liver parenchyma which is favored to be secondary to hepatic steatosis. 4. Probable areas of focal fatty sparing adjacent to the gallbladder fossa. Electronically Signed   By: Constance Holster M.D.   On: 06/14/2019 00:16     Labs:   Basic Metabolic Panel: Recent Labs  Lab 06/13/19 1754 06/14/19 0239 06/15/19 0828  NA 132* 133* 136  K 3.8 4.1 3.8  CL 93* 94* 97*  CO2 27 24 27   GLUCOSE  115* 90 100*  BUN 8 8 7   CREATININE 0.59 0.57 0.47  CALCIUM 8.7* 8.7* 8.5*   GFR CrCl cannot be calculated (Unknown ideal weight.). Liver Function Tests: Recent Labs  Lab 06/13/19 1754 06/14/19 0239 06/15/19 0828  AST 4,101* 4,448* 2,141*  ALT 5,730* 5,949* 4,030*  ALKPHOS 141* 133* 149*  BILITOT 4.2* 4.1* 6.5*  PROT 7.3 7.4 6.8  ALBUMIN 3.7 3.7 3.4*   No results for input(s): LIPASE, AMYLASE in the last 168 hours. No results for input(s): AMMONIA in the last 168 hours. Coagulation profile Recent Labs  Lab 06/13/19 2234 06/14/19 0556 06/15/19 0828  INR 1.8* 2.4* 1.4*    CBC: Recent Labs  Lab 06/13/19 1754 06/14/19 0330 06/15/19 0828  WBC 7.1 6.9 9.8  NEUTROABS  3.9 3.1 3.3  HGB 17.0* 14.4 15.3*  HCT 50.8* 43.5 46.0  MCV 90.2 91.4 91.5  PLT 270 257 320   Cardiac Enzymes: No results for input(s): CKTOTAL, CKMB, CKMBINDEX, TROPONINI in the last 168 hours. BNP: Invalid input(s): POCBNP CBG: No results for input(s): GLUCAP in the last 168 hours. D-Dimer No results for input(s): DDIMER in the last 72 hours. Hgb A1c No results for input(s): HGBA1C in the last 72 hours. Lipid Profile No results for input(s): CHOL, HDL, LDLCALC, TRIG, CHOLHDL, LDLDIRECT in the last 72 hours. Thyroid function studies No results for input(s): TSH, T4TOTAL, T3FREE, THYROIDAB in the last 72 hours.  Invalid input(s): FREET3 Anemia work up Recent Labs    06/13/19 1754  RETICCTPCT 0.9   Microbiology Recent Results (from the past 240 hour(s))  SARS CORONAVIRUS 2 (TAT 6-24 HRS) Nasopharyngeal Nasopharyngeal Swab     Status: None   Collection Time: 06/13/19 10:22 PM   Specimen: Nasopharyngeal Swab  Result Value Ref Range Status   SARS Coronavirus 2 NEGATIVE NEGATIVE Final    Comment: (NOTE) SARS-CoV-2 target nucleic acids are NOT DETECTED. The SARS-CoV-2 RNA is generally detectable in upper and lower respiratory specimens during the acute phase of infection. Negative results do not preclude SARS-CoV-2 infection, do not rule out co-infections with other pathogens, and should not be used as the sole basis for treatment or other patient management decisions. Negative results must be combined with clinical observations, patient history, and epidemiological information. The expected result is Negative. Fact Sheet for Patients: SugarRoll.be Fact Sheet for Healthcare Providers: https://www.woods-mathews.com/ This test is not yet approved or cleared by the [place] FDA and  has been authorized for detection and/or diagnosis of SARS-CoV-2 by FDA under an Emergency Use Authorization (EUA). This EUA will remain  in effect  (meaning this test can be used) for the duration of the COVID-19 declaration under Section 56 4(b)(1) of the Act, 21 U.S.C. section 360bbb-3(b)(1), unless the authorization is terminated or revoked sooner. Performed at [facility], [place] [address], [place], [place] [address]     Please note: You were cared for by a hospitalist during your hospital stay. Once you are discharged, your primary care physician will handle any further medical issues. Please note that NO REFILLS for any discharge medications will be authorized once you are discharged, as it is imperative that you return to your primary care physician (or establish a relationship with a primary care physician if you do not have one) for your post hospital discharge needs so that they can reassess your need for medications and monitor your lab values.  Signed: Terrilee Croak  [facility] 06/15/2019, 11:36 AM

## 2019-06-15 NOTE — Progress Notes (Signed)
Progress Note   Subjective  Chief Complaint: Acute Hepatitis A  Patient feeling much better today.  Explains that she does feel like her abdomen is distended but doing well.  She was able to tolerate a diet last night and this morning.  Continues to feel some fatigue, but believes she can handle this at home.    Objective   Vital signs in last 24 hours: Temp:  [98.3 F (36.8 C)-100 F (37.8 C)] 98.7 F (37.1 C) (12/17 0600) Pulse Rate:  [72-85] 74 (12/17 1014) Resp:  [18-19] 18 (12/17 0600) BP: (135-163)/(91-103) 136/101 (12/17 1014) SpO2:  [97 %-99 %] 98 % (12/17 1014) Weight:  [74.5 kg] 74.5 kg (12/17 0600) Last BM Date: 06/15/19 General:    white female in NAD, with icterus Heart:  Regular rate and rhythm; no murmurs Lungs: Respirations even and unlabored, lungs CTA bilaterally Abdomen:  Soft, nontender and nondistended. Normal bowel sounds. Extremities:  Without edema. Neurologic:  Alert and oriented,  grossly normal neurologically. Psych:  Cooperative. Normal mood and affect.  Intake/Output from previous day: 12/16 0701 - 12/17 0700 In: 240 [P.O.:240] Out: -   Lab Results: Recent Labs    06/13/19 1754 06/14/19 0330 06/15/19 0828  WBC 7.1 6.9 9.8  HGB 17.0* 14.4 15.3*  HCT 50.8* 43.5 46.0  PLT 270 257 320   BMET Recent Labs    06/13/19 1754 06/14/19 0239 06/15/19 0828  NA 132* 133* 136  K 3.8 4.1 3.8  CL 93* 94* 97*  CO2 27 24 27   GLUCOSE 115* 90 100*  BUN 8 8 7   CREATININE 0.59 0.57 0.47  CALCIUM 8.7* 8.7* 8.5*   LFT Recent Labs    06/14/19 0239 06/15/19 0828  PROT 7.4 6.8  ALBUMIN 3.7 3.4*  AST 4,448* 2,141*  ALT 5,949* 4,030*  ALKPHOS 133* 149*  BILITOT 4.1* 6.5*  BILIDIR 2.0*  --   IBILI 2.1*  --    PT/INR Recent Labs    06/14/19 0556 06/15/19 0828  LABPROT 25.9* 17.0*  INR 2.4* 1.4*    Studies/Results: DG Chest 2 View  Result Date: 06/13/2019 CLINICAL DATA:  Chest pain, fever, body aches. EXAM: CHEST - 2 VIEW  COMPARISON:  10/24/2013 FINDINGS: Heart and mediastinal contours are within normal limits. No focal opacities or effusions. No acute bony abnormality. IMPRESSION: No active cardiopulmonary disease. Electronically Signed   By: Rolm Baptise M.D.   On: 06/13/2019 18:11   US Abdomen Limited  Result Date: 06/14/2019 CLINICAL DATA:  Elevated LFTs. EXAM: ULTRASOUND ABDOMEN LIMITED RIGHT UPPER QUADRANT COMPARISON:  None recent FINDINGS: Gallbladder: The gallbladder wall is thickened. The sonographic Percell Miller sign is negative. There is some pericholecystic free fluid. This may be mixed with areas of focal fatty sparing. There are no gallstones. The gallbladder is relatively under distended. Common bile duct: Diameter: 3 mm Liver: Diffuse increased echogenicity with slightly heterogeneous liver. Appearance typically secondary to fatty infiltration. Fibrosis secondary consideration. No secondary findings of cirrhosis noted. No focal hepatic lesion or intrahepatic biliary duct dilatation. Portal vein is patent on color Doppler imaging with normal direction of blood flow towards the liver. Other: None. IMPRESSION: 1. No sonographic evidence for cholelithiasis. 2. Mild gallbladder wall thickening and pericholecystic free fluid in the absence of a positive sonographic Murphy sign is non-specific. The gallbladder is relatively under distended which may account for the wall thickening. 3. Coarsened echogenic heterogeneous appearance of the liver parenchyma which is favored to be secondary to hepatic steatosis. 4. Probable  areas of focal fatty sparing adjacent to the gallbladder fossa. Electronically Signed   By: Constance Holster M.D.   On: 06/14/2019 00:16    Assessment / Plan:   Assessment: 1.  Acute hepatitis A: With elevated LFTs, hyperbilirubinemia, coagulopathy, LFTs improving, bilirubin slightly increasing which is not unexpected 2.  Elevated liver enzymes 3.  Hyperbilirubinemia 4.  Coagulopathy 5.   Steatosis  Plan: 1.  Continue supportive care for acute viral hepatitis 2.  Discussed with patient that she could be discharged home today, recommend that we have a recheck of LFTs in a week to ensure that these continue to trend down.  She will also be arranged to follow-up in our clinic in 2 to 3 weeks with Dr. Bryan Lemma or myself.  We will sign off, please let us know if we can be of any further assistance.   LOS: 0 days   Levin Erp  06/15/2019, 11:17 AM

## 2019-06-16 ENCOUNTER — Telehealth: Payer: Self-pay

## 2019-06-16 NOTE — Telephone Encounter (Signed)
-----   Message from Levin Erp, [place] sent at 06/15/2019 11:21 AM EST ----- Regarding: Needs follow up Please arrange for patient to have LFT's in a week- please call and let her know.  Also needs follow up appt with Dr. Bryan Lemma or myself in 2-3 weeks for acute hepatitis A  Thanks-JLL

## 2019-06-16 NOTE — Telephone Encounter (Signed)
Pt scheduled to see Ellouise Newer PA 07/06/19@2pm . Appt letter mailed to pt.

## 2019-06-19 LAB — CULTURE, BLOOD (ROUTINE X 2)
Culture: NO GROWTH
Special Requests: ADEQUATE

## 2019-07-06 ENCOUNTER — Ambulatory Visit: Payer: Self-pay | Admitting: Physician Assistant

## 2019-07-24 ENCOUNTER — Other Ambulatory Visit: Payer: Self-pay | Admitting: Family Medicine

## 2019-07-24 DIAGNOSIS — F419 Anxiety disorder, unspecified: Secondary | ICD-10-CM

## 2019-07-27 ENCOUNTER — Other Ambulatory Visit: Payer: Self-pay | Admitting: Family Medicine

## 2019-07-27 DIAGNOSIS — F419 Anxiety disorder, unspecified: Secondary | ICD-10-CM

## 2019-07-28 ENCOUNTER — Encounter: Payer: Self-pay | Admitting: Family Medicine

## 2019-07-28 MED ORDER — ALPRAZOLAM 0.5 MG PO TABS: 0.5000 mg | ORAL_TABLET | Freq: Two times a day (BID) | ORAL | 3 refills | Status: DC | PRN

## 2019-08-11 ENCOUNTER — Encounter: Payer: Self-pay | Admitting: Family Medicine

## 2019-08-11 ENCOUNTER — Telehealth: Payer: Self-pay | Admitting: Family Medicine

## 2019-08-11 DIAGNOSIS — B159 Hepatitis A without hepatic coma: Secondary | ICD-10-CM

## 2019-08-11 DIAGNOSIS — I10 Essential (primary) hypertension: Secondary | ICD-10-CM

## 2019-08-11 MED ORDER — AMLODIPINE BESYLATE 5 MG PO TABS: 5.0000 mg | ORAL_TABLET | Freq: Every day | ORAL | 1 refills | Status: DC

## 2019-08-11 NOTE — Telephone Encounter (Signed)
Please advise patient order has been placed for liver functions. She does not need to make appt or be fasting. Please leave order at lab.  Have sent prescription for amlodipine to [facility], she needs to schedule follow up in about a month.

## 2019-08-11 NOTE — Telephone Encounter (Signed)
Subject Delivery         Visit Follow-Up Question 08/11/2019 12:49 PM Reply   To: [facility]    From: [name]    Created: 08/11/2019 12:49 PM     *-*-*This message was handled on 08/11/2019 1:21 PM by Lelon Huh E*-*-*  When I was last there, my BP was higher than usual and dr Caryn Section changed my BP medication to valsartan. I couldn't connect during our scheduled zoom appointment and I was actually suffering from acute liver failure and hospitalized and diagnosed with Hep A that week. I haven't taken the valsartan (due to its side affects on the liver) since my illness. I would like to have my liver enzyme labs checked to see how my liver recovery is going. I would also like to start taking my previous BP med ASAP (Amlodipine). Ward DC'd my Amlodipine. Please call in a new prescription for it as I am not taking anything right now.   Thank you so much.

## 2019-08-11 NOTE — Telephone Encounter (Signed)
LMTCB, okay for [facility] to advise patient and schedule follow up. Lab slip is printed at the front desk.

## 2019-08-11 NOTE — Telephone Encounter (Signed)
Call to patient- notified of PCP comments and directions. Follow up appointment has been scheduled.

## 2019-09-07 ENCOUNTER — Encounter: Payer: Self-pay | Admitting: Family Medicine

## 2019-09-08 ENCOUNTER — Other Ambulatory Visit: Payer: Self-pay

## 2019-09-08 DIAGNOSIS — F419 Anxiety disorder, unspecified: Secondary | ICD-10-CM

## 2019-09-08 MED ORDER — ALPRAZOLAM 0.5 MG PO TABS: 0.5000 mg | ORAL_TABLET | Freq: Two times a day (BID) | ORAL | 3 refills | Status: DC | PRN

## 2019-09-22 LAB — COMPREHENSIVE METABOLIC PANEL
ALT: 40 IU/L — ABNORMAL HIGH (ref 0–32)
AST: 27 IU/L (ref 0–40)
Albumin/Globulin Ratio: 1.6 (ref 1.2–2.2)
Albumin: 4.4 g/dL (ref 3.8–4.9)
Alkaline Phosphatase: 110 IU/L (ref 39–117)
BUN/Creatinine Ratio: 20 (ref 9–23)
BUN: 15 mg/dL (ref 6–24)
Bilirubin Total: 0.2 mg/dL (ref 0.0–1.2)
CO2: 24 mmol/L (ref 20–29)
Calcium: 10.1 mg/dL (ref 8.7–10.2)
Chloride: 101 mmol/L (ref 96–106)
Creatinine, Ser: 0.75 mg/dL (ref 0.57–1.00)
GFR calc Af Amer: 105 mL/min/{1.73_m2} (ref >59)
GFR calc non Af Amer: 91 mL/min/{1.73_m2} (ref >59)
Globulin, Total: 2.8 g/dL (ref 1.5–4.5)
Glucose: 97 mg/dL (ref 65–99)
Potassium: 3.9 mmol/L (ref 3.5–5.2)
Sodium: 141 mmol/L (ref 134–144)
Total Protein: 7.2 g/dL (ref 6.0–8.5)

## 2019-09-25 ENCOUNTER — Other Ambulatory Visit: Payer: Self-pay

## 2019-09-25 ENCOUNTER — Ambulatory Visit: Payer: BC Managed Care – PPO | Admitting: Family Medicine

## 2019-09-25 ENCOUNTER — Encounter: Payer: Self-pay | Admitting: Family Medicine

## 2019-09-25 VITALS — BP 140/90 | HR 71 | Temp 97.5°F | Resp 16 | Wt 164.0 lb

## 2019-09-25 DIAGNOSIS — E781 Pure hyperglyceridemia: Secondary | ICD-10-CM | POA: Diagnosis not present

## 2019-09-25 DIAGNOSIS — I10 Essential (primary) hypertension: Secondary | ICD-10-CM | POA: Diagnosis not present

## 2019-09-25 MED ORDER — AMLODIPINE BESYLATE 10 MG PO TABS: 10.0000 mg | ORAL_TABLET | Freq: Every day | ORAL | 2 refills | Status: DC

## 2019-09-25 NOTE — Progress Notes (Signed)
Established patient visit      Patient: Olivia Meyer   DOB: Sep 21, 1965   54 y.o. Female  MRN: [id number] Visit Date: 09/25/2019  Today's healthcare provider: Lelon Huh, MD  Subjective:    Chief Complaint  Patient presents with  . Hypertension  . Hyperlipidemia   HPI  Hypertension, follow-up:  BP Readings from Last 3 Encounters:  09/25/19 140/90  06/15/19 (!) 136/101  05/29/19 (!) 150/100    She was last seen for hypertension 4 months ago.  BP at that visit was 150/100. Management during that visit includes discontinuing Amlodipine and increasing Valsartan to 125m daily. Since that visit, patient stopped Valsartan when she was hospitalized in December for hepatitis A, and restarted Amlodipine. She had met C done last week with findings of normal AST, and slightly elevated ALT of 40, (n <33) She reports good compliance with treatment. She has increased taking Amlodipine to 134mdaily. She is not having side effects.  She is exercising. She is adherent to low salt diet.   Outside blood pressures are not checked. She is experiencing none.  Patient denies chest pain, chest pressure/discomfort, claudication, dyspnea, exertional chest pressure/discomfort, fatigue, irregular heart beat, lower extremity edema, near-syncope, orthopnea, palpitations, paroxysmal nocturnal dyspnea, syncope and tachypnea.   Cardiovascular risk factors include dyslipidemia and hypertension.  Use of agents associated with hypertension: none.     Weight trend: fluctuating a bit Wt Readings from Last 3 Encounters:  09/25/19 164 lb (74.4 kg)  06/15/19 164 lb 3.9 oz (74.5 kg)  05/29/19 177 lb 12.8 oz (80.6 kg)    Current diet: well balanced  ------------------------------------------------------------------------  Lipid/Cholesterol, Follow-up:   Last seen for this 4 months ago.  Management changes since that visit include starting Atorvastatin 1022maily. . Last Lipid Panel:      Component Value Date/Time   CHOL 245 (H) 05/29/2019 1504   TRIG 585 (HH) 05/29/2019 1504   HDL 46 05/29/2019 1504   CHOLHDL 5.3 (H) 05/29/2019 1504   LDLCALC 100 (H) 05/29/2019 1504    Risk factors for vascular disease include hypercholesterolemia and hypertension  She reports poor compliance with treatment. Patient was not aware that she was supposed to take any medication for elevated cholesterol.  She is not having side effects.  Current symptoms include none and have been stable. Weight trend: fluctuating a bit Prior visit with dietician: no Current diet: well balanced Current exercise: walking  Wt Readings from Last 3 Encounters:  09/25/19 164 lb (74.4 kg)  06/15/19 164 lb 3.9 oz (74.5 kg)  05/29/19 177 lb 12.8 oz (80.6 kg)    -------------------------------------------------------------------      Medications: Outpatient Medications Prior to Visit  Medication Sig  . ALPRAZolam Take 1 tablet (0.5 mg total) by mouth 2 (two) times daily as needed. for anxiety  . amLODipine Take 1 tablet (5 mg total) by mouth daily. (Patient taking differently: Take 10 mg by mouth daily. )  . B COMPLEX VITAMINS PO Take 1 tablet by mouth daily.  . VMarland KitchenTAMIN E PO Take 1 capsule by mouth daily.   . aMarland Kitchenorvastatin Take 1 tablet (10 mg total) by mouth daily. (Patient not taking: Reported on 06/13/2019)   No facility-administered medications prior to visit.    Review of Systems  Constitutional: Negative for appetite change, chills, fatigue and fever.  Respiratory: Negative for chest tightness and shortness of breath.   Cardiovascular: Negative for chest pain and palpitations.  Gastrointestinal: Negative for abdominal pain, nausea and vomiting.  Neurological: Negative for dizziness and weakness.        Objective:    BP 140/90 (BP Location: Right Arm, Patient Position: Sitting, Cuff Size: Normal)   Pulse 71   Temp (!) 97.5 F (36.4 C) (Temporal)   Resp 16   Wt 164 lb (74.4 kg)    SpO2 99% Comment: room air  BMI 29.05 kg/m    Physical Exam   General: Appearance:     Overweight female in no acute distress  Eyes:    PERRL, conjunctiva/corneas clear, EOM's intact       Lungs:     Clear to auscultation bilaterally, respirations unlabored  Heart:    Normal heart rate. Normal rhythm. No murmurs, rubs, or gallops.   MS:   All extremities are intact.   Neurologic:   Awake, alert, oriented x 3. No apparent focal neurological           defect.           Assessment & Plan:    1. Essential (primary) hypertension Fairly well controlled refill - amLODipine (NORVASC) 10 MG tablet; Take 1 tablet (10 mg total) by mouth daily.  Dispense: 90 tablet; Refill: 2  2. Hypertriglyceridemia She is working on diet. Will plan an rechecking lipids at follow up Future Appointments  Date Time Provider Price  01/26/2020  8:40 AM [name], MD [facility]       The entirety of the information documented in the History of Present Illness, Review of Systems and Physical Exam were personally obtained by me. Portions of this information were initially documented by Meyer Cory, CMA and reviewed by me for thoroughness and accuracy.     Lelon Huh, MD  [facility] [phone] (phone) [phone] (fax)  [place]

## 2019-12-21 ENCOUNTER — Emergency Department (HOSPITAL_COMMUNITY): Payer: BC Managed Care – PPO

## 2019-12-21 ENCOUNTER — Encounter (HOSPITAL_COMMUNITY): Payer: Self-pay | Admitting: Emergency Medicine

## 2019-12-21 ENCOUNTER — Emergency Department (HOSPITAL_COMMUNITY)
Admission: EM | Admit: 2019-12-21 | Discharge: 2019-12-21 | Disposition: A | Discharge status: Home / Self Care | Payer: BC Managed Care – PPO | Attending: Emergency Medicine | Admitting: Emergency Medicine

## 2019-12-21 DIAGNOSIS — H5711 Ocular pain, right eye: Secondary | ICD-10-CM | POA: Diagnosis present

## 2019-12-21 DIAGNOSIS — W01198A Fall on same level from slipping, tripping and stumbling with subsequent striking against other object, initial encounter: Secondary | ICD-10-CM | POA: Insufficient documentation

## 2019-12-21 DIAGNOSIS — Z96641 Presence of right artificial hip joint: Secondary | ICD-10-CM | POA: Diagnosis not present

## 2019-12-21 DIAGNOSIS — Y939 Activity, unspecified: Secondary | ICD-10-CM | POA: Insufficient documentation

## 2019-12-21 DIAGNOSIS — Y999 Unspecified external cause status: Secondary | ICD-10-CM | POA: Insufficient documentation

## 2019-12-21 DIAGNOSIS — Y929 Unspecified place or not applicable: Secondary | ICD-10-CM | POA: Diagnosis not present

## 2019-12-21 DIAGNOSIS — S0591XA Unspecified injury of right eye and orbit, initial encounter: Secondary | ICD-10-CM | POA: Diagnosis not present

## 2019-12-21 DIAGNOSIS — Z87891 Personal history of nicotine dependence: Secondary | ICD-10-CM | POA: Insufficient documentation

## 2019-12-21 MED ORDER — FLUORESCEIN SODIUM 1 MG OP STRP
1.0000 | ORAL_STRIP | Freq: Once | OPHTHALMIC | Status: AC
  Administered 2019-12-21: 1 via OPHTHALMIC
  Filled 2019-12-21: qty 1

## 2019-12-21 MED ORDER — TETRACAINE HCL 0.5 % OP SOLN
1.0000 [drp] | Freq: Once | OPHTHALMIC | Status: AC
  Administered 2019-12-21: 1 [drp] via OPHTHALMIC
  Filled 2019-12-21: qty 4

## 2019-12-21 NOTE — Discharge Instructions (Signed)
As we discussed, please go directly to the eye doctors office.   His information is listed above.

## 2019-12-21 NOTE — ED Triage Notes (Signed)
Pt reports was doing yoga in the yard on Saturday and fell over and hit her head on a rock. Reports having right eye pain and pressure as well as blurred vision in right eye. States that she has been really lethargic since yesterday. Denies LOC or taking blood thinners.

## 2019-12-21 NOTE — ED Provider Notes (Signed)
[facility] DEPT Provider Note   CSN: [id number] Arrival date & time: 12/21/19  1015     History Chief Complaint  Patient presents with  . Fall  . Eye Pain  . headache    Olivia Meyer is a 54 y.o. female past med history of anxiety, depression, hepatitis who presents for evaluation of right eye pain, redness that has been ongoing for last 4 days.  She states that she started noticing some right eye irritation about 4 days ago.  She thought she may have had a corneal abrasion.  She tried washing her contacts.  She states that despite that, she has continued to have irritation, blurry vision.  She states that she has photosensitivity to light as well as some watery drainage from the eye.  She states that it feels like it hurts behind the eye as well.  She has not noted any surrounding periorbital edema, erythema.  She does not recall any specific trauma to the eye.  He does report that 5 days ago, she was doing yoga and was doing a position where he was propping herself up by her elbows.  She states that she fell and hit the top part of her head on a rock.  She states she did not actually hit her eye during this fall.  She denies any other trauma to the eye.  She also reports that she feels like she has some pain behind the eye that goes into her head.  She states she does have a history of migraines and states that she will typically get pain behind her left eye.  She does feel like this is different than her normal migraine.  She feels like this pain is more in her eye itself.  She states that she did not have any LOC after the injury 5 days ago.  She is not on blood thinners.  She has not noted any nausea/vomiting, numbness/weakness, fevers.  She does report feeling more tired and fatigued yesterday like she wanted to close her eyes.  She does wear contacts.  The history is provided by the patient.       Past Medical History:  Diagnosis Date  . Anxiety   .  Arthritis    RIGHT HIP OA AND PAIN  . Cancer (HCC)    BASAL CANCER REMOVED LEFT UPPER LIP  . Depression   . Expected blood loss anemia 11/01/2013  . Hepatitis A 05/2019  . History of chicken pox   . Numbness    RIGHT LEG NUMBNESS SINCE NOV 2014 - HAS HAD ULTRASOUND - NO BLOOD CLOT     Patient Active Problem List   Diagnosis Date Noted  . Coagulopathy ([place])   . Hyperbilirubinemia   . Acute hepatitis 06/13/2019  . Pre-diabetes 04/30/2019  . Hyperglycemia 03/15/2018  . Chronic migraine 09/27/2017  . 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 Hip osteoarthritis 02/05/2015  . History of basal cell cancer 02/05/2015  . Low back pain radiating to right leg 02/05/2015  . Menopausal symptom 02/05/2015  . Overweight (BMI 25.0-29.9) 11/01/2013  . S/P total hip arthroplasty 10/31/2013  . Hypertriglyceridemia 02/01/2009  . Family history of colonic polyps 09/18/2007  . Essential (primary) hypertension 08/22/2007  . Anxiety 06/29/2004  . Depressive disorder 06/29/2004  . History of herpes genitalis 06/29/2004    Past Surgical History:  Procedure Laterality Date  . 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 APPENDECTOMY  2001  . BASAL CELL  CARCINOMA EXCISION  2008  . BREAST SURGERY  1998   BREAST AUGMENTATION  . [place]  . HIP ARTHROSCOPY Right 2013  . MOHS SURGERY  2014   LEFT UPPER LIP  . TOTAL HIP ARTHROPLASTY Right 10/31/2013   Procedure: RIGHT TOTAL HIP ARTHROPLASTY ANTERIOR APPROACH;  Surgeon: Mauri Pole, MD;  Location: [facility];  Service: Orthopedics;  Laterality: Right;  . TUBAL LIGATION  1999     OB History    Gravida  4   Para  3   Term      Preterm      AB      Living        SAB      TAB      Ectopic      Multiple      Live Births              Family History  Problem Relation Age of Onset  . Kidney failure Father   . Non-Hodgkin's lymphoma Father   . Cervical cancer Mother   . Melanoma Mother   .  Hypertension Mother   . Irritable bowel syndrome Mother   . Dementia Mother   . Vascular Disease Maternal Grandmother   . Stomach cancer Maternal Grandfather   . ALS Paternal Grandfather   . ALS Paternal Aunt   . ALS Cousin     Social History   Tobacco Use  . Smoking status: Former Smoker    Quit date: 06/29/1985    Years since quitting: 34.5  . Smokeless tobacco: Never Used  Vaping Use  . Vaping Use: Never used  Substance Use Topics  . Alcohol use: Not Currently    Alcohol/week: 0.0 standard drinks  . Drug use: No    Home Medications Prior to Admission medications   Medication Sig Start Date End Date Taking? Authorizing Provider  ALPRAZolam Duanne Moron) 0.5 MG tablet Take 1 tablet (0.5 mg total) by mouth 2 (two) times daily as needed. for anxiety 09/08/19  Yes [name], MD  amLODipine (NORVASC) 10 MG tablet Take 1 tablet (10 mg total) by mouth daily. Patient taking differently: Take 10 mg by mouth at bedtime.  09/25/19  Yes [name], MD  hydrochlorothiazide (HYDRODIURIL) 25 MG tablet Take 25 mg by mouth every 3 (three) days.   Yes [provider]  Potassium 99 MG TABS Take 99 mg by mouth daily.   Yes [provider]    Allergies    Ace inhibitors, Morphine and related, and Prednisone  Review of Systems   Review of Systems  Constitutional: Negative for fever.  Eyes: Positive for photophobia, pain, discharge, redness and visual disturbance.  Gastrointestinal: Negative for nausea and vomiting.  Genitourinary: Negative for dysuria and hematuria.  Neurological: Negative for weakness, numbness and headaches.  All other systems reviewed and are negative.   Physical Exam Updated Vital Signs BP (!) 161/105 (BP Location: Right Arm)   Pulse 71   Temp 98 F (36.7 C) (Oral)   Resp 16   SpO2 100%   Physical Exam Vitals and nursing note reviewed.  Constitutional:      Appearance: She is well-developed.  HENT:     Head: Normocephalic and  atraumatic.     Comments: No tenderness to palpation of skull. No deformities or crepitus noted. No open wounds, abrasions or lacerations.  Eyes:     General: No scleral icterus.  Right eye: No discharge.        Left eye: No discharge.     Extraocular Movements: Extraocular movements intact.     Conjunctiva/sclera:     Right eye: Right conjunctiva is injected.     Comments: Conjunctival injection noted on the right. PERRL.  EOMs intact though she does report some pain in the right eye with lateral movement.  She has consensual pain noted.  Visual fields intact.  She can correctly identify how many fingers I am holding up.  Right periorbital region is without any erythema, ecchymosis, edema.  No tenderness palpation noted to the right periorbital region.  Pulmonary:     Effort: Pulmonary effort is normal.  Skin:    General: Skin is warm and dry.  Neurological:     Mental Status: She is alert.     Comments: Cranial nerves III-XII intact Follows commands, Moves all extremities  5/5 strength to BUE and BLE  Sensation intact throughout all major nerve distributions No slurred speech. No facial droop.   Psychiatric:        Speech: Speech normal.        Behavior: Behavior normal.     ED Results / Procedures / Treatments   Labs (all labs ordered are listed, but only abnormal results are displayed) Labs Reviewed - No data to display  EKG None  Radiology CT Head Wo Contrast  Result Date: 12/21/2019 CLINICAL DATA:  Headache status post fall EXAM: CT HEAD WITHOUT CONTRAST TECHNIQUE: Contiguous axial images were obtained from the base of the skull through the vertex without intravenous contrast. COMPARISON:  None. FINDINGS: Brain: No evidence of acute infarction, hemorrhage, hydrocephalus, extra-axial collection or mass lesion/mass effect. Vascular: No hyperdense vessel or unexpected calcification. Skull: No osseous abnormality. Sinuses/Orbits: Mild mucosal thickening of the right  maxillary sinus. Visualized mastoid sinuses are clear. Visualized orbits demonstrate no focal abnormality. Other: None IMPRESSION: No acute intracranial pathology. Electronically Signed   By: Kathreen Devoid   On: 12/21/2019 13:42   CT Orbits Wo Contrast  Result Date: 12/21/2019 CLINICAL DATA:  Fall.  Headache.  Head injury. EXAM: CT ORBITS WITHOUT CONTRAST TECHNIQUE: Multidetector CT images were obtained using the standard protocol without intravenous contrast. COMPARISON:  CT head today FINDINGS: Orbits:  No orbital fracture.  No orbital mass or edema. Visualized sinuses: Mild mucosal edema maxillary sinus bilaterally. No air-fluid levels. Mastoid clear bilaterally. Middle ear clear bilaterally. Soft tissues: No significant soft tissue swelling or mass. Limited intracranial: Negative Skeletal: No facial fracture. Nasal bone intact. No fracture of the orbit. Periapical lucency around right upper third molar. Moderate degenerative change right TMJ with joint space narrowing and spurring. Normal left TMJ. IMPRESSION: Negative for orbital or facial fracture Periapical lucency around right upper third molar compatible with infection. Electronically Signed   By: Franchot Gallo M.D.   On: 12/21/2019 14:04    Procedures Procedures (including critical care time)  Medications Ordered in ED Medications  tetracaine (PONTOCAINE) 0.5 % ophthalmic solution 1 drop (1 drop Both Eyes Given by Other 12/21/19 1144)  fluorescein ophthalmic strip 1 strip (1 strip Both Eyes Given by Other 12/21/19 1145)    ED Course  I have reviewed the triage vital signs and the nursing notes.  Pertinent labs & imaging results that were available during my care of the patient were reviewed by me and considered in my medical decision making (see chart for details).    MDM Rules/Calculators/A&P  54 year old female who presents for evaluation of right thigh pain, redness, blurry vision, photophobia that has been  ongoing for the last 4 days.  She states that she does have history of migraines to her left eye but this feels different.  This feels like more pain in the eye itself.  She has not noted any fevers.  She does report that about 5 days ago, she hit her head after doing a yoga pose.  She states that she did not hit the eye itself.  No other trauma.  She has a contact wearer.  She last wore her contacts yesterday.  She has not noted any fevers.  On initially arrival, she is afebrile, nontoxic-appearing.  Vital signs are stable.  On exam, she does have conjunctival injection noted in the right.  EOMs are intact but she does have some pain with lateral movement.  Head exam is without any signs of trauma.  She has a normal neuro exam.  She does have evidence of consensual pain on exam.  Concern for process such as iritis.  She does report history of falling.  She does not think she specifically hit her eye and there is no evidence of trauma around the eye.  Doubt traumatic iritis.  History/physical exam not concerning for orbital or periorbital cellulitis.  Additionally, do not suspect fracture, bony abnormality.  She states she did not hit the eye itself and has no surrounding signs of trauma, injury.  Additionally, do not suspect intracranial hemorrhage, CVA as etiology of her symptoms.  She does report that she hit her head on a rock about 5 days ago.  She was propping herself up on the elbow.  No LOC.  She is not on blood thinners.  She has a normal neuro exam.  She does state that this is different from her normal migraine.  Woods lamp evaluation showed no evidence of corneal abrasion, fluorescein uptake.  Evaluation with slit lamp showed no evidence of foreign body, did not visualize any cells or flares.  Intraocular pressure as documented below:  Left IOP: 18, 19 Right IOP: 19, 16    Visual Acuity  Right Eye Distance: 20/50 Left Eye Distance: 20/25 Bilateral Distance: 20/20  Discussed patient with Dr.  Elmer Sow ([facility]) who recommends that the patient come see him in his office immediately.   CT head and CT orbit negative for any acute abnormality.  Updated patient on plan. Will plan for her to go to [facility] for further evaluation. Patient provided instructions. At this time, patient exhibits no emergent life-threatening condition that require further evaluation in ED or admission. Patient had ample opportunity for questions and discussion. All patient's questions were answered with full understanding. Strict return precautions discussed. Patient expresses understanding and agreement to plan.   Portions of this note were generated with Lobbyist. Dictation errors may occur despite best attempts at proofreading.   Final Clinical Impression(s) / ED Diagnoses Final diagnoses:  Pain of right eye    Rx / DC Orders ED Discharge Orders    None       Desma Mcgregor 12/21/19 1438    Little, Wenda Overland, MD 12/21/19 1552

## 2020-01-26 ENCOUNTER — Ambulatory Visit: Payer: Self-pay | Admitting: Family Medicine

## 2020-02-13 ENCOUNTER — Encounter: Payer: Self-pay | Admitting: Family Medicine

## 2020-02-13 DIAGNOSIS — F419 Anxiety disorder, unspecified: Secondary | ICD-10-CM

## 2020-02-13 MED ORDER — HYDROCHLOROTHIAZIDE 25 MG PO TABS: 25.0000 mg | ORAL_TABLET | ORAL | 0 refills | Status: DC

## 2020-02-13 MED ORDER — ALPRAZOLAM 0.5 MG PO TABS: 0.5000 mg | ORAL_TABLET | Freq: Two times a day (BID) | ORAL | 0 refills | Status: DC | PRN

## 2020-02-21 ENCOUNTER — Other Ambulatory Visit: Payer: Self-pay

## 2020-02-21 ENCOUNTER — Encounter: Payer: Self-pay | Admitting: Family Medicine

## 2020-02-21 DIAGNOSIS — Z20822 Contact with and (suspected) exposure to covid-19: Secondary | ICD-10-CM

## 2020-02-23 ENCOUNTER — Encounter: Payer: Self-pay | Admitting: Family Medicine

## 2020-02-24 LAB — NOVEL CORONAVIRUS, NAA: SARS-CoV-2, NAA: NOT DETECTED

## 2020-02-26 NOTE — Telephone Encounter (Signed)
Patient can have a note excusing from work the days she was out waiting for Covid test results.

## 2020-02-27 ENCOUNTER — Ambulatory Visit: Payer: BC Managed Care – PPO | Admitting: Family Medicine

## 2020-02-27 ENCOUNTER — Telehealth: Payer: Self-pay

## 2020-02-27 DIAGNOSIS — R7303 Prediabetes: Secondary | ICD-10-CM

## 2020-02-27 DIAGNOSIS — E781 Pure hyperglyceridemia: Secondary | ICD-10-CM

## 2020-02-27 DIAGNOSIS — R739 Hyperglycemia, unspecified: Secondary | ICD-10-CM

## 2020-02-27 NOTE — Telephone Encounter (Signed)
Patient was on the schedule to be seen today at 3:40 for a 4 month follow up. Patient had a COVID test done 6 days ago on 02/21/2020 and the result was negative. Patient is still having symptoms (headache and earache) so her follow up appointment was rescheduled to 03/29/2020 at 3:40pm. She is going to an urgent care to have her symptoms checked out.   For her follow up appointment on 03/29/2020, patient wants to know if you are going order labs during that visit? If so, she would like to have the labs done prior to the visit so we can go over the results during that appointment.

## 2020-02-27 NOTE — Telephone Encounter (Signed)
Have place future order, needs to be fasting

## 2020-02-27 NOTE — Telephone Encounter (Signed)
mychart message sent to patient

## 2020-03-28 NOTE — Progress Notes (Signed)
Established patient visit   Patient: Olivia Meyer   DOB: 24-Jan-1966   54 y.o. Female  MRN: [id number] Visit Date: 03/29/2020  Today's healthcare provider: Lelon Huh, MD   Chief Complaint  Patient presents with  . Hyperlipidemia  . Hypertension  . Foot Pain   Subjective    HPI   Needs refill on alprazolam which she states remains effective.   Hypertension, follow-up  BP Readings from Last 3 Encounters:  03/29/20 (!) 160/91  12/21/19 (!) 161/105  09/25/19 140/90   Wt Readings from Last 3 Encounters:  03/29/20 159 lb 12.8 oz (72.5 kg)  09/25/19 164 lb (74.4 kg)  06/15/19 164 lb 3.9 oz (74.5 kg)     She was last seen for hypertension 6 months ago.  BP at that visit was 140/90. Management since that visit includes continue the same medications.   She reports excellent compliance with treatment. She is not having side effects.  She is following a Regular, Low Sodium diet. She is exercising. She does not smoke.  Use of agents associated with hypertension: none.   Outside blood pressures are not being checked.  Pertinent labs: Lab Results  Component Value Date   CHOL 245 (H) 05/29/2019   HDL 46 05/29/2019   LDLCALC 100 (H) 05/29/2019   TRIG 585 (HH) 05/29/2019   CHOLHDL 5.3 (H) 05/29/2019   Lab Results  Component Value Date   NA 141 09/21/2019   K 3.9 09/21/2019   CREATININE 0.75 09/21/2019   GFRNONAA 91 09/21/2019   GFRAA 105 09/21/2019   GLUCOSE 97 09/21/2019     The 10-year ASCVD risk score Mikey Bussing DC Jr., et al., 2013) is: 5.2%   --------------------------------------------------------------------------------------------------- Hypertriglyceridemia- 09/25/19  She also complains of chronic pain in both feet especially around bunion on right foot which she would like to have fixed at [facility] orthopedics     Medications: Outpatient Medications Prior to Visit  Medication Sig  . ALPRAZolam (XANAX) 0.5 MG tablet Take 1 tablet (0.5 mg total)  by mouth 2 (two) times daily as needed. for anxiety  . amLODipine (NORVASC) 10 MG tablet Take 1 tablet (10 mg total) by mouth daily. (Patient taking differently: Take 10 mg by mouth at bedtime. )  . hydrochlorothiazide (HYDRODIURIL) 25 MG tablet Take 1 tablet (25 mg total) by mouth every 3 (three) days.  . Potassium 99 MG TABS Take 99 mg by mouth daily.   No facility-administered medications prior to visit.      Objective    BP (!) 160/91 (BP Location: Right Arm, Patient Position: Sitting, Cuff Size: Normal)   Pulse 64   Temp 98.5 F (36.9 C) (Oral)   Wt 159 lb 12.8 oz (72.5 kg)   BMI 28.31 kg/m    Physical Exam  General appearance:  Well developed, well nourished female, cooperative and in no acute distress Head: Normocephalic, without obvious abnormality, atraumatic Respiratory: Respirations even and unlabored, normal respiratory rate MS: Bilateral foot bunions larger on right  No results found for any visits on 03/29/20.  Assessment & Plan     1. Hypertriglyceridemia Due to check- Lipid panel  2. Essential (primary) hypertension Uncontrolled. Will need to add or increase medications after reviewing labs.  - Aldosterone + renin activity w/ ratio - Comprehensive metabolic panel  3. Colon cancer screening Patient sent with iFOBT kit today.   4. Pain in both feet She has bunions, worse on the right, plantar fasciitis, and hammer toe. She specifically requests to  be referred to Wylene Simmer - Ambulatory referral to Orthopedic Surgery  5. Need for influenza vaccination Recommended flu vaccine which she declined.    6. Pre-diabetes  -HgbA1c      The entirety of the information documented in the History of Present Illness, Review of Systems and Physical Exam were personally obtained by me. Portions of this information were initially documented by the CMA and reviewed by me for thoroughness and accuracy.      Lelon Huh, MD  [facility] [phone] (phone) [phone] (fax)  [place]

## 2020-03-29 ENCOUNTER — Encounter: Payer: Self-pay | Admitting: Family Medicine

## 2020-03-29 ENCOUNTER — Other Ambulatory Visit: Payer: Self-pay

## 2020-03-29 ENCOUNTER — Telehealth: Payer: Self-pay

## 2020-03-29 ENCOUNTER — Ambulatory Visit (INDEPENDENT_AMBULATORY_CARE_PROVIDER_SITE_OTHER): Payer: BC Managed Care – PPO | Admitting: Family Medicine

## 2020-03-29 VITALS — BP 160/91 | HR 64 | Temp 98.5°F | Wt 159.8 lb

## 2020-03-29 DIAGNOSIS — Z1211 Encounter for screening for malignant neoplasm of colon: Secondary | ICD-10-CM

## 2020-03-29 DIAGNOSIS — M79671 Pain in right foot: Secondary | ICD-10-CM

## 2020-03-29 DIAGNOSIS — E781 Pure hyperglyceridemia: Secondary | ICD-10-CM | POA: Diagnosis not present

## 2020-03-29 DIAGNOSIS — I10 Essential (primary) hypertension: Secondary | ICD-10-CM | POA: Diagnosis not present

## 2020-03-29 DIAGNOSIS — M2011 Hallux valgus (acquired), right foot: Secondary | ICD-10-CM

## 2020-03-29 DIAGNOSIS — Z23 Encounter for immunization: Secondary | ICD-10-CM

## 2020-03-29 DIAGNOSIS — R7303 Prediabetes: Secondary | ICD-10-CM

## 2020-03-29 DIAGNOSIS — M79672 Pain in left foot: Secondary | ICD-10-CM

## 2020-03-29 NOTE — Patient Instructions (Addendum)
Please review the attached list of medications and notify my office if there are any errors.  ? ?Please call the [facility] ([phone]) to schedule a routine screening mammogram. ? ?

## 2020-03-29 NOTE — Telephone Encounter (Signed)
Patient was in office today for a follow up and would like to know about getting a referral to Dr. Wylene Simmer at [facility] for a bunion on her foot, hammer toe and plantar fasciitis. Please advise? Patient would like to have surgery before 2022.

## 2020-03-30 ENCOUNTER — Encounter: Payer: Self-pay | Admitting: Family Medicine

## 2020-04-01 ENCOUNTER — Other Ambulatory Visit: Payer: Self-pay

## 2020-04-01 DIAGNOSIS — I10 Essential (primary) hypertension: Secondary | ICD-10-CM

## 2020-04-01 DIAGNOSIS — F419 Anxiety disorder, unspecified: Secondary | ICD-10-CM

## 2020-04-01 MED ORDER — ALPRAZOLAM 0.5 MG PO TABS: 0.5000 mg | ORAL_TABLET | Freq: Two times a day (BID) | ORAL | 1 refills | Status: DC | PRN

## 2020-04-01 MED ORDER — HYDROCHLOROTHIAZIDE 25 MG PO TABS: 25.0000 mg | ORAL_TABLET | Freq: Every day | ORAL | 1 refills | Status: DC

## 2020-04-01 NOTE — Telephone Encounter (Signed)
Patient sent mychart message requesting refill.

## 2020-04-04 ENCOUNTER — Other Ambulatory Visit: Payer: Self-pay | Admitting: Family Medicine

## 2020-04-11 ENCOUNTER — Other Ambulatory Visit: Payer: Self-pay | Admitting: Family Medicine

## 2020-04-11 DIAGNOSIS — E2609 Other primary hyperaldosteronism: Secondary | ICD-10-CM | POA: Insufficient documentation

## 2020-04-11 LAB — LIPID PANEL
Chol/HDL Ratio: 3.8 ratio (ref 0.0–4.4)
Cholesterol, Total: 234 mg/dL — ABNORMAL HIGH (ref 100–199)
HDL: 62 mg/dL (ref >39)
LDL Chol Calc (NIH): 136 mg/dL — ABNORMAL HIGH (ref 0–99)
Triglycerides: 203 mg/dL — ABNORMAL HIGH (ref 0–149)
VLDL Cholesterol Cal: 36 mg/dL (ref 5–40)

## 2020-04-11 LAB — COMPREHENSIVE METABOLIC PANEL
ALT: 24 IU/L (ref 0–32)
AST: 25 IU/L (ref 0–40)
Albumin/Globulin Ratio: 1.8 (ref 1.2–2.2)
Albumin: 4.6 g/dL (ref 3.8–4.9)
Alkaline Phosphatase: 97 IU/L (ref 44–121)
BUN/Creatinine Ratio: 15 (ref 9–23)
BUN: 11 mg/dL (ref 6–24)
Bilirubin Total: 0.6 mg/dL (ref 0.0–1.2)
CO2: 25 mmol/L (ref 20–29)
Calcium: 9.6 mg/dL (ref 8.7–10.2)
Chloride: 100 mmol/L (ref 96–106)
Creatinine, Ser: 0.74 mg/dL (ref 0.57–1.00)
GFR calc Af Amer: 107 mL/min/{1.73_m2} (ref >59)
GFR calc non Af Amer: 93 mL/min/{1.73_m2} (ref >59)
Globulin, Total: 2.5 g/dL (ref 1.5–4.5)
Glucose: 89 mg/dL (ref 65–99)
Potassium: 3.6 mmol/L (ref 3.5–5.2)
Sodium: 139 mmol/L (ref 134–144)
Total Protein: 7.1 g/dL (ref 6.0–8.5)

## 2020-04-11 LAB — ALDOSTERONE + RENIN ACTIVITY W/ RATIO
ALDOS/RENIN RATIO: >34.1 — ABNORMAL HIGH (ref 0.0–30.0)
ALDOSTERONE: 5.7 ng/dL (ref 0.0–30.0)
Renin: <0.167 ng/mL/hr — ABNORMAL LOW (ref 0.167–5.380)

## 2020-04-11 LAB — HEMOGLOBIN A1C
Est. average glucose Bld gHb Est-mCnc: 114 mg/dL
Hgb A1c MFr Bld: 5.6 % (ref 4.8–5.6)

## 2020-04-13 ENCOUNTER — Encounter: Payer: Self-pay | Admitting: Family Medicine

## 2020-04-29 ENCOUNTER — Ambulatory Visit
Admission: RE | Admit: 2020-04-29 | Discharge: 2020-04-29 | Disposition: A | Discharge status: Home / Self Care | Payer: BC Managed Care – PPO | Source: Ambulatory Visit | Attending: Family Medicine | Admitting: Family Medicine

## 2020-04-29 DIAGNOSIS — E2609 Other primary hyperaldosteronism: Secondary | ICD-10-CM

## 2020-05-01 ENCOUNTER — Other Ambulatory Visit: Payer: Self-pay | Admitting: Family Medicine

## 2020-05-01 DIAGNOSIS — E2609 Other primary hyperaldosteronism: Secondary | ICD-10-CM

## 2020-05-01 DIAGNOSIS — I10 Essential (primary) hypertension: Secondary | ICD-10-CM

## 2020-05-01 MED ORDER — SPIRONOLACTONE 25 MG PO TABS: 25.0000 mg | ORAL_TABLET | Freq: Every day | ORAL | 1 refills | Status: DC

## 2020-05-08 ENCOUNTER — Encounter: Payer: Self-pay | Admitting: Family Medicine

## 2020-05-08 DIAGNOSIS — I10 Essential (primary) hypertension: Secondary | ICD-10-CM

## 2020-05-08 DIAGNOSIS — E2609 Other primary hyperaldosteronism: Secondary | ICD-10-CM

## 2020-05-27 ENCOUNTER — Other Ambulatory Visit: Payer: Self-pay | Admitting: Family Medicine

## 2020-05-27 DIAGNOSIS — R739 Hyperglycemia, unspecified: Secondary | ICD-10-CM

## 2020-05-27 DIAGNOSIS — R7303 Prediabetes: Secondary | ICD-10-CM

## 2020-05-27 DIAGNOSIS — E781 Pure hyperglyceridemia: Secondary | ICD-10-CM

## 2020-05-28 ENCOUNTER — Encounter: Payer: Self-pay | Admitting: Family Medicine

## 2020-05-28 DIAGNOSIS — F419 Anxiety disorder, unspecified: Secondary | ICD-10-CM

## 2020-05-28 DIAGNOSIS — E2609 Other primary hyperaldosteronism: Secondary | ICD-10-CM

## 2020-05-28 DIAGNOSIS — I10 Essential (primary) hypertension: Secondary | ICD-10-CM

## 2020-06-12 MED ORDER — ALPRAZOLAM 0.5 MG PO TABS: 0.5000 mg | ORAL_TABLET | Freq: Two times a day (BID) | ORAL | 2 refills | Status: DC | PRN

## 2020-06-12 NOTE — Addendum Note (Signed)
Addended by: [name] on: 06/12/2020 12:53 PM   Modules accepted: Orders

## 2020-08-14 ENCOUNTER — Telehealth: Payer: Self-pay

## 2020-08-14 ENCOUNTER — Encounter: Payer: Self-pay | Admitting: Family Medicine

## 2020-08-14 DIAGNOSIS — I10 Essential (primary) hypertension: Secondary | ICD-10-CM

## 2020-08-14 DIAGNOSIS — E2609 Other primary hyperaldosteronism: Secondary | ICD-10-CM

## 2020-08-14 NOTE — Telephone Encounter (Signed)
Patient came by the lab to get blood work done that was ordered a few months ago. She said she was supposed to have her potassium levels checked. I didn't see any future orders. Please advise which labs need to be ordered. She also mentioned a stool test. I saw an order for an OC light and gave her another kit. Please advise on which labs to order.

## 2020-08-15 MED ORDER — SPIRONOLACTONE 25 MG PO TABS: 25.0000 mg | ORAL_TABLET | Freq: Every day | ORAL | 0 refills | Status: DC

## 2020-08-15 NOTE — Telephone Encounter (Signed)
I printed an order for renal panel and sent pt mychart message advising her she needs to get labs down. Please leave order for her at front desk. Thanks.

## 2020-08-15 NOTE — Telephone Encounter (Signed)
Done

## 2020-08-24 LAB — RENAL FUNCTION PANEL
Albumin: 4.7 g/dL (ref 3.8–4.9)
BUN/Creatinine Ratio: 22 (ref 9–23)
BUN: 15 mg/dL (ref 6–24)
CO2: 21 mmol/L (ref 20–29)
Calcium: 10 mg/dL (ref 8.7–10.2)
Chloride: 99 mmol/L (ref 96–106)
Creatinine, Ser: 0.67 mg/dL (ref 0.57–1.00)
GFR calc Af Amer: 115 mL/min/{1.73_m2} (ref >59)
GFR calc non Af Amer: 100 mL/min/{1.73_m2} (ref >59)
Glucose: 101 mg/dL — ABNORMAL HIGH (ref 65–99)
Phosphorus: 4.1 mg/dL (ref 3.0–4.3)
Potassium: 4.4 mmol/L (ref 3.5–5.2)
Sodium: 137 mmol/L (ref 134–144)

## 2020-08-30 ENCOUNTER — Other Ambulatory Visit: Payer: Self-pay

## 2020-08-30 ENCOUNTER — Encounter: Payer: Self-pay | Admitting: Family Medicine

## 2020-08-30 ENCOUNTER — Ambulatory Visit: Payer: BC Managed Care – PPO | Admitting: Family Medicine

## 2020-08-30 DIAGNOSIS — I10 Essential (primary) hypertension: Secondary | ICD-10-CM | POA: Diagnosis not present

## 2020-08-30 DIAGNOSIS — E2609 Other primary hyperaldosteronism: Secondary | ICD-10-CM

## 2020-08-30 MED ORDER — SPIRONOLACTONE 50 MG PO TABS: 50.0000 mg | ORAL_TABLET | Freq: Every day | ORAL | 1 refills | Status: DC

## 2020-08-30 MED ORDER — AMLODIPINE BESYLATE 10 MG PO TABS: 5.0000 mg | ORAL_TABLET | Freq: Every day | ORAL | Status: DC

## 2020-08-30 NOTE — Patient Instructions (Addendum)
.   Please review the attached list of medications and notify my office if there are any errors.   Olivia Meyer need to check your electrolytes including potassium level in about 3 weeks

## 2020-08-30 NOTE — Progress Notes (Signed)
Established patient visit   Patient: Olivia Meyer   DOB: 1965/11/10   55 y.o. Female  MRN: [id number] Visit Date: 08/30/2020  Today's healthcare provider: Lelon Huh, MD   Chief Complaint  Patient presents with  . Hypertension   Subjective    HPI  Hypertension, follow-up  BP Readings from Last 3 Encounters:  08/30/20 (!) 158/98  03/29/20 (!) 160/91  12/21/19 (!) 161/105   Wt Readings from Last 3 Encounters:  08/30/20 177 lb (80.3 kg)  03/29/20 159 lb 12.8 oz (72.5 kg)  09/25/19 164 lb (74.4 kg)     She was last seen for hypertension 5 months ago.  BP at that visit was 160/91. Management since that visit includes ordering labs.  She reports fair compliance with treatment. Patient reports that she stopped taking Amlodipine a few days ago to see if it changed her energy levels. She also increased Spironolactone to 2 tablets daily ( totaling 50mg  per day) for the last 4 days.  She is not having side effects.  She is following a Regular diet. She is exercising. She does not smoke.  Use of agents associated with hypertension: none.   Outside blood pressures are average 128/82. Symptoms: No chest pain No chest pressure  Yes palpitations No syncope  No dyspnea No orthopnea  No paroxysmal nocturnal dyspnea No lower extremity edema   Pertinent labs: Lab Results  Component Value Date   CHOL 234 (H) 04/04/2020   HDL 62 04/04/2020   LDLCALC 136 (H) 04/04/2020   TRIG 203 (H) 04/04/2020   CHOLHDL 3.8 04/04/2020   Lab Results  Component Value Date   NA 137 08/23/2020   K 4.4 08/23/2020   CREATININE 0.67 08/23/2020   GFRNONAA 100 08/23/2020   GFRAA 115 08/23/2020   GLUCOSE 101 (H) 08/23/2020     The 10-year ASCVD risk score Mikey Bussing DC Jr., et al., 2013) is: 3.9%   ---------------------------------------------------------------------------------------------------     Medications: Outpatient Medications Prior to Visit  Medication Sig  . ALPRAZolam (XANAX)  0.5 MG tablet Take 1 tablet (0.5 mg total) by mouth 2 (two) times daily as needed. for anxiety  . spironolactone (ALDACTONE) 25 MG tablet Take 1 tablet (25 mg total) by mouth daily. (Patient taking differently: Take 50 mg by mouth daily.)  . amLODipine (NORVASC) 10 MG tablet Take 1 tablet (10 mg total) by mouth daily. (Patient not taking: Reported on 08/30/2020)   No facility-administered medications prior to visit.    Review of Systems  Constitutional: Positive for fatigue. Negative for appetite change, chills and fever.  Respiratory: Negative for chest tightness and shortness of breath.   Cardiovascular: Negative for chest pain and palpitations.  Gastrointestinal: Negative for abdominal pain, nausea and vomiting.  Neurological: Negative for dizziness and weakness.       Objective    BP (!) 158/98 (BP Location: Left Arm, Patient Position: Sitting, Cuff Size: Normal)   Pulse 71   Temp 97.7 F (36.5 C) (Temporal)   Resp 16   Wt 177 lb (80.3 kg)   BMI 31.35 kg/m     Physical Exam  . General appearance: Mildly obese female, cooperative and in no acute distress Head: Normocephalic, without obvious abnormality, atraumatic Respiratory: Respirations even and unlabored, normal respiratory rate Extremities: All extremities are intact.  Skin: Skin color, texture, turgor normal. No rashes seen  Psych: Appropriate mood and affect. Neurologic: Mental status: Alert, oriented to person, place, and time, thought content appropriate.  Assessment & Plan     1. Essential (primary) hypertension She is currently off of amlodipine and has been taking 50mg  amlodipine this week. She reports mildly elevated home BP. Will continue- spironolactone (ALDACTONE) 50 MG tablet; Take 1 tablet (50 mg total) by mouth daily.  Dispense: 30 tablet; Refill: 1 Will start back amlodipine, but only take Take 0.5 tablets (5 mg total) by mouth daily instead of 10mg   2. Primary aldosteronism (HCC) Adrenal Ct  normal.   continue- spironolactone (ALDACTONE) 50 MG tablet; Take 1 tablet (50 mg total) by mouth daily.  Dispense: 30 tablet; Refill: 1   Recheck electrolytes in about 3 weeks.   Follow up endocrinology in April as scheduled.  No follow-ups on file.         Lelon Huh, MD  [facility] [phone] (phone) [phone] (fax)  [place]

## 2020-09-19 ENCOUNTER — Encounter: Payer: Self-pay | Admitting: Family Medicine

## 2020-09-23 ENCOUNTER — Encounter: Payer: Self-pay | Admitting: Family Medicine

## 2020-09-23 DIAGNOSIS — F419 Anxiety disorder, unspecified: Secondary | ICD-10-CM

## 2020-09-23 DIAGNOSIS — I10 Essential (primary) hypertension: Secondary | ICD-10-CM

## 2020-09-24 MED ORDER — ALPRAZOLAM 0.5 MG PO TABS: 0.5000 mg | ORAL_TABLET | Freq: Two times a day (BID) | ORAL | 2 refills | Status: DC | PRN

## 2020-09-24 MED ORDER — AMLODIPINE BESYLATE 5 MG PO TABS: 5.0000 mg | ORAL_TABLET | Freq: Every day | ORAL | 3 refills | Status: DC

## 2020-12-28 ENCOUNTER — Encounter: Payer: Self-pay | Admitting: Family Medicine

## 2020-12-28 DIAGNOSIS — M25559 Pain in unspecified hip: Secondary | ICD-10-CM

## 2021-01-28 ENCOUNTER — Encounter: Payer: Self-pay | Admitting: Family Medicine

## 2021-01-28 DIAGNOSIS — F419 Anxiety disorder, unspecified: Secondary | ICD-10-CM

## 2021-01-29 MED ORDER — ALPRAZOLAM 0.5 MG PO TABS: 0.5000 mg | ORAL_TABLET | Freq: Two times a day (BID) | ORAL | 2 refills | Status: DC | PRN

## 2021-05-19 ENCOUNTER — Encounter: Payer: Self-pay | Admitting: Family Medicine

## 2021-05-19 DIAGNOSIS — I10 Essential (primary) hypertension: Secondary | ICD-10-CM

## 2021-05-19 DIAGNOSIS — F419 Anxiety disorder, unspecified: Secondary | ICD-10-CM

## 2021-05-19 DIAGNOSIS — E2609 Other primary hyperaldosteronism: Secondary | ICD-10-CM

## 2021-05-19 MED ORDER — ALPRAZOLAM 0.5 MG PO TABS: 0.5000 mg | ORAL_TABLET | Freq: Two times a day (BID) | ORAL | 2 refills | Status: DC | PRN

## 2021-05-19 MED ORDER — SPIRONOLACTONE 50 MG PO TABS: 50.0000 mg | ORAL_TABLET | Freq: Every day | ORAL | 1 refills | Status: DC

## 2021-05-19 NOTE — Telephone Encounter (Signed)
Last office visit: 08/30/2020 No future office visit scheduled.   Last refill: -Alprazolam:  01/29/2021 #60 with 2 refills -Spironolactone: 08/30/2020 #30 with 1 refill

## 2021-05-28 ENCOUNTER — Encounter: Payer: Self-pay | Admitting: Family Medicine

## 2021-06-17 NOTE — Telephone Encounter (Signed)
Please advise patient that she needs to schedule in-office visit to discuss hormone replacement therapy.

## 2021-06-27 ENCOUNTER — Encounter: Payer: Self-pay | Admitting: Family Medicine

## 2021-06-27 ENCOUNTER — Other Ambulatory Visit: Payer: Self-pay

## 2021-06-27 ENCOUNTER — Ambulatory Visit (INDEPENDENT_AMBULATORY_CARE_PROVIDER_SITE_OTHER): Payer: Self-pay | Admitting: Family Medicine

## 2021-06-27 VITALS — BP 154/99 | HR 69 | Temp 98.1°F | Ht 63.0 in | Wt 179.1 lb

## 2021-06-27 DIAGNOSIS — F419 Anxiety disorder, unspecified: Secondary | ICD-10-CM

## 2021-06-27 DIAGNOSIS — Z1231 Encounter for screening mammogram for malignant neoplasm of breast: Secondary | ICD-10-CM

## 2021-06-27 DIAGNOSIS — I10 Essential (primary) hypertension: Secondary | ICD-10-CM

## 2021-06-27 DIAGNOSIS — N951 Menopausal and female climacteric states: Secondary | ICD-10-CM

## 2021-06-27 MED ORDER — AMLODIPINE BESYLATE 10 MG PO TABS: 10.0000 mg | ORAL_TABLET | Freq: Every day | ORAL | 3 refills | Status: DC

## 2021-06-27 MED ORDER — BUSPIRONE HCL 10 MG PO TABS: ORAL_TABLET | ORAL | 1 refills | Status: DC

## 2021-06-27 MED ORDER — ESTRADIOL 0.05 MG/24HR TD PTTW: 1.0000 | MEDICATED_PATCH | TRANSDERMAL | 2 refills | Status: DC

## 2021-06-27 NOTE — Patient Instructions (Signed)
Please call the [facility] at [facility] at [phone] to schedule your mammogram.  

## 2021-06-27 NOTE — Progress Notes (Signed)
Established patient visit   Patient: Olivia Meyer   DOB: 1966-03-02   55 y.o. Female  MRN: [id number] Visit Date: 06/27/2021  Today's healthcare provider: Lelon Huh, MD   No chief complaint on file.  Subjective    HPI  -Patient states she is interested in estrogen patches to help with mood swings and hot flashes.   She also would like to stop taking Xanax which she has been taking once or twice a day for many years for anxiety. She is interested in changing to buspirone. She is concerned about effect of xanax on cognition.   Hypertension, follow-up  BP Readings from Last 3 Encounters:  08/30/20 (!) 158/98  03/29/20 (!) 160/91  12/21/19 (!) 161/105   Wt Readings from Last 3 Encounters:  08/30/20 177 lb (80.3 kg)  03/29/20 159 lb 12.8 oz (72.5 kg)  09/25/19 164 lb (74.4 kg)     She was last seen for hypertension 9 months ago.  BP at that visit was 158/98. Management since that visit includes continue- spironolactone (ALDACTONE) 50 MG tablet; Take 1 tablet (50 mg total) by mouth daily.  Dispense: 30 tablet; Refill: 1 Will start back amlodipine, but only take Take 0.5 tablets (5 mg total) by mouth daily instead of 10mg .  She reports excellent compliance with treatment. She is not having side effects.  She is following a  Low carb, no red meat, no sugar and mediterranean  diet. She is exercising. She does smoke.  Use of agents associated with hypertension: none.   Outside blood pressures are n/a Symptoms: No chest pain No chest pressure  No palpitations No syncope  No dyspnea No orthopnea  No paroxysmal nocturnal dyspnea No lower extremity edema   Pertinent labs: Lab Results  Component Value Date   CHOL 234 (H) 04/04/2020   HDL 62 04/04/2020   LDLCALC 136 (H) 04/04/2020   TRIG 203 (H) 04/04/2020   CHOLHDL 3.8 04/04/2020   Lab Results  Component Value Date   NA 137 08/23/2020   K 4.4 08/23/2020   CREATININE 0.67 08/23/2020   GFRNONAA 100 08/23/2020    GLUCOSE 101 (H) 08/23/2020   TSH 1.510 08/31/2016     The 10-year ASCVD risk score (Arnett DK, et al., 2019) is: 4.3%   --------------------------------------------------------------------------------------------------- Anxiety, Follow-up  She was last seen for anxiety 2 years ago. (08/09/2018) Changes made at last visit include refill- ALPRAZolam (XANAX) 0.5 MG tablet; Take 1 tablet (0.5 mg total) by mouth 2 (two) times daily as needed for anxiety..   She reports excellent compliance with treatment. She reports good tolerance of treatment. She is not having side effects.   She feels her anxiety is moderate to severe at times and Unchanged since last visit.  Symptoms: No chest pain Yes difficulty concentrating  No dizziness Yes fatigue  No feelings of losing control Yes insomnia  No irritable Yes palpitations  Yes panic attacks No racing thoughts  No shortness of breath Yes sweating  No tremors/shakes    GAD-7 Results No flowsheet data found.  PHQ-9 Scores PHQ9 SCORE ONLY 03/09/2018 09/18/2015  PHQ-9 Total Score 5 3    ---------------------------------------------------------------------------------------------------   Medications: Outpatient Medications Prior to Visit  Medication Sig   ALPRAZolam (XANAX) 0.5 MG tablet Take 1 tablet (0.5 mg total) by mouth 2 (two) times daily as needed. for anxiety   amLODipine (NORVASC) 5 MG tablet Take 1 tablet (5 mg total) by mouth daily.   spironolactone (ALDACTONE) 50 MG  tablet Take 1 tablet (50 mg total) by mouth daily.   No facility-administered medications prior to visit.    Review of Systems     Objective    BP (!) 154/99 (BP Location: Right Arm, Patient Position: Sitting, Cuff Size: Normal)    Pulse 69    Temp 98.1 F (36.7 C) (Oral)    Ht 5\' 3"  (1.6 m)    Wt 179 lb 1.6 oz (81.2 kg)    SpO2 99%    BMI 31.73 kg/m  {Show previous vital signs (optional):23777}  Physical Exam  Awake, alert, oriented x 3. In no apparent  distress    Assessment & Plan     1. Anxiety She is going to work on getting off of alprazolam. In the meantime will start. busPIRone (BUSPAR) 10 MG tablet; Take 1/2 tablet at night  for 7 days, then 1/2 twice a day for 7 days, then 1/2 in the morning and 1 at night, then 1 twice a day.  Dispense: 60 tablet; Refill: 1  2. Essential (primary) hypertension Tolerating current medications, but not controlled, will increase amLODipine (NORVASC) 10 MG tablet; Take 1 tablet (10 mg total) by mouth daily.  Dispense: 30 tablet; Refill: 3  3. Breast cancer screening by mammogram  - MM 3D Screening Breast Bilateral - [place]; Future  4. Menopausal symptoms start - estradiol (VIVELLE-DOT) 0.05 MG/24HR patch; Place 1 patch (0.05 mg total) onto the skin 2 (two) times a week.  Dispense: 8 patch; Refill: 2  Advised she will need to get screening mammogram, and will not continue ERT if she does not stay up to date on breast cancer screening.   Future Appointments  Date Time Provider [place]  09/23/2021 11:00 AM [name], MD [id number]    She declined flu vaccine.        The entirety of the information documented in the History of Present Illness, Review of Systems and Physical Exam were personally obtained by me. Portions of this information were initially documented by the CMA and reviewed by me for thoroughness and accuracy.     Lelon Huh, MD  [facility] [phone] (phone) [phone] (fax)  [place]

## 2021-07-30 ENCOUNTER — Ambulatory Visit: Payer: BC Managed Care – PPO | Admitting: Family Medicine

## 2021-07-31 ENCOUNTER — Other Ambulatory Visit: Payer: Self-pay | Admitting: Family Medicine

## 2021-07-31 DIAGNOSIS — I10 Essential (primary) hypertension: Secondary | ICD-10-CM

## 2021-07-31 DIAGNOSIS — E2609 Other primary hyperaldosteronism: Secondary | ICD-10-CM

## 2021-09-23 ENCOUNTER — Ambulatory Visit: Payer: Medicaid Other | Admitting: Family Medicine

## 2021-09-30 ENCOUNTER — Ambulatory Visit: Payer: Self-pay | Admitting: Family Medicine

## 2021-10-06 ENCOUNTER — Ambulatory Visit (INDEPENDENT_AMBULATORY_CARE_PROVIDER_SITE_OTHER): Payer: Self-pay | Admitting: Family Medicine

## 2021-10-06 ENCOUNTER — Encounter: Payer: Self-pay | Admitting: Family Medicine

## 2021-10-06 VITALS — BP 150/92 | HR 71 | Temp 97.9°F | Resp 16 | Wt 179.9 lb

## 2021-10-06 DIAGNOSIS — I10 Essential (primary) hypertension: Secondary | ICD-10-CM

## 2021-10-06 DIAGNOSIS — F419 Anxiety disorder, unspecified: Secondary | ICD-10-CM

## 2021-10-06 DIAGNOSIS — Z1211 Encounter for screening for malignant neoplasm of colon: Secondary | ICD-10-CM

## 2021-10-06 IMAGING — CT CT HEAD W/O CM
3 series · 15 of 47 positions shown, 18 images · non-contrast
Comparison: None.

CLINICAL DATA: Headache status post fall

EXAM:
CT HEAD WITHOUT CONTRAST
TECHNIQUE: Contiguous axial images were obtained from the base of the skull
through the vertex without intravenous contrast.

[Series 2: head wo · axial · 0.47mm/px · z∈[-123,+2]mm · 9 of 30 slices shown, 12 images]
[im 3/30  brain]
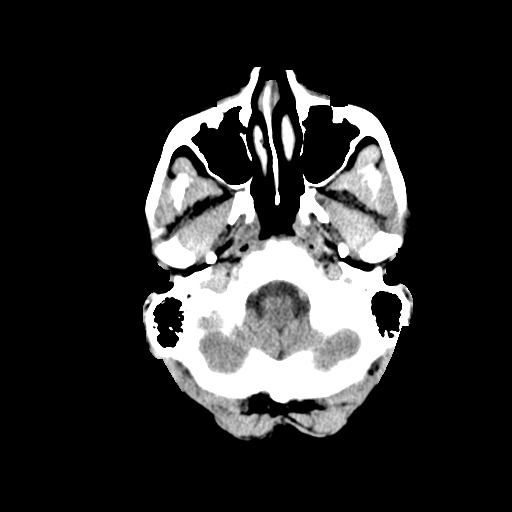
[im 3/30  bone]
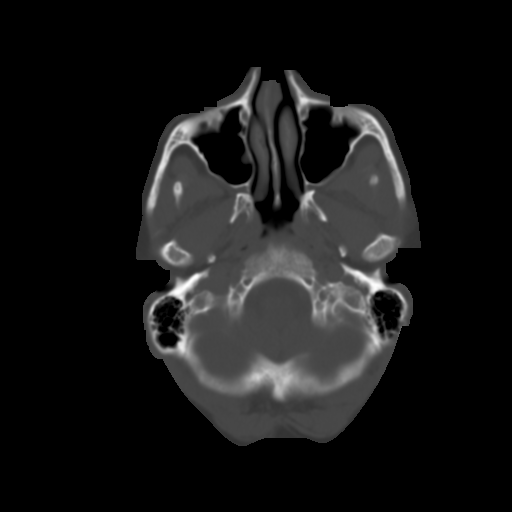
[im 6/30  brain]
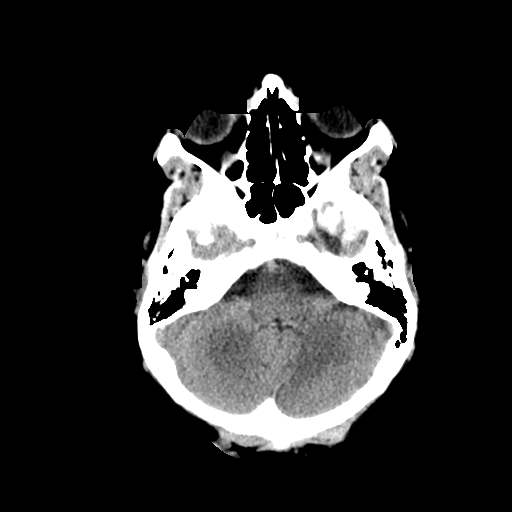
[im 9/30  brain]
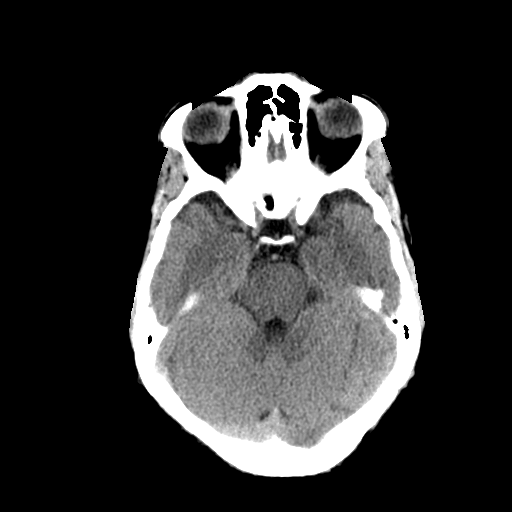
[im 12/30  brain]
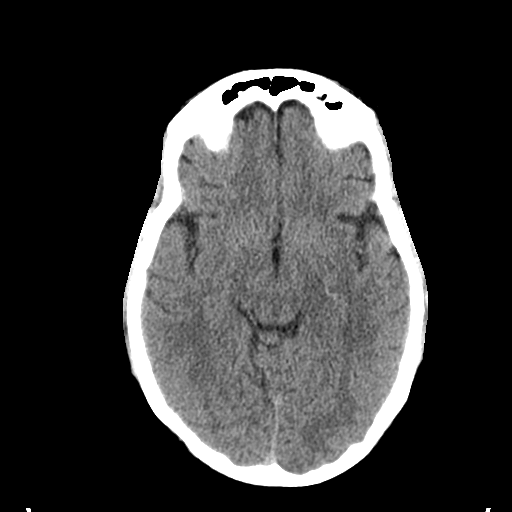
[im 16/30  brain]
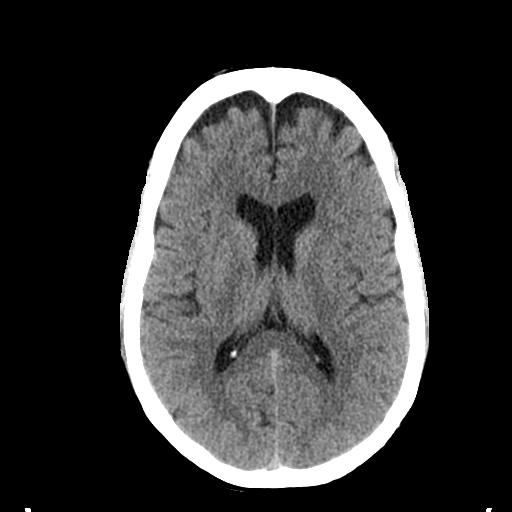
[im 16/30  bone]
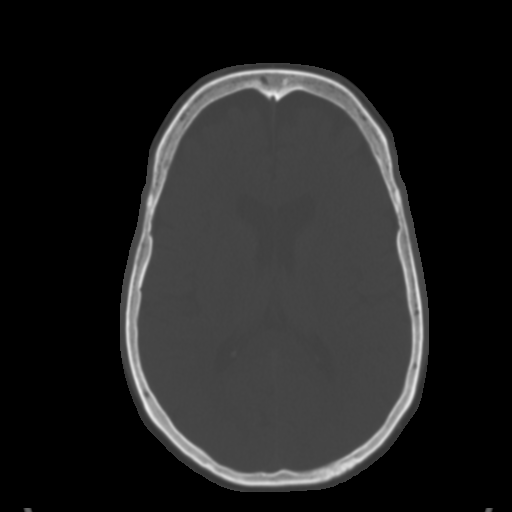
[im 19/30  brain]
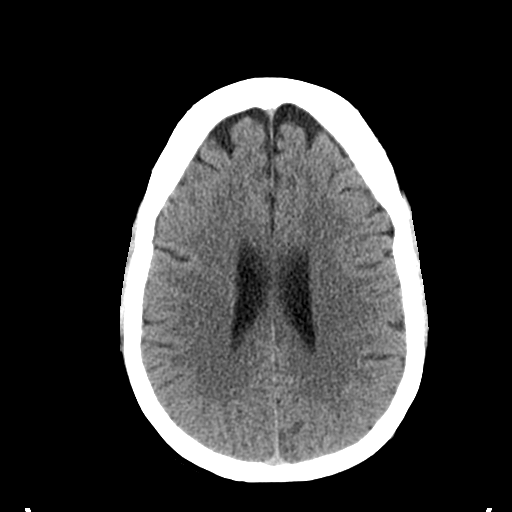
[im 22/30  brain]
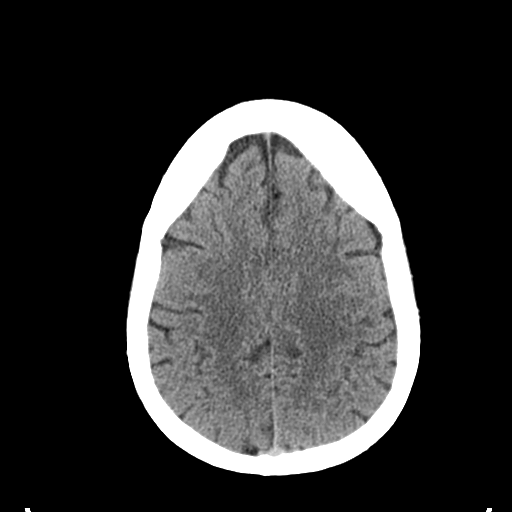
[im 25/30  brain]
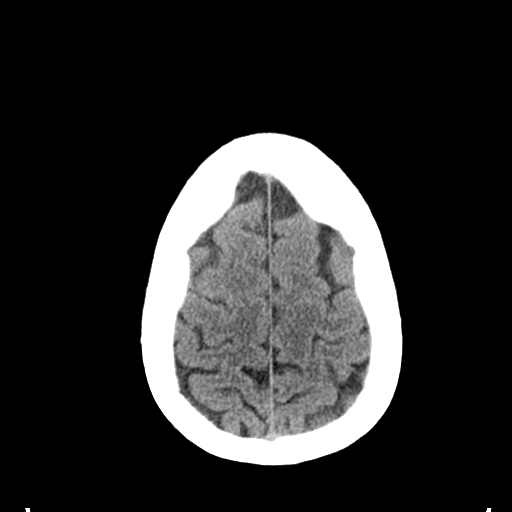
[im 28/30  brain]
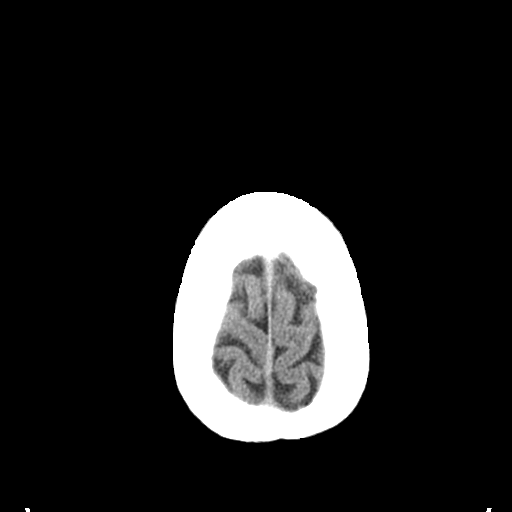
[im 28/30  bone]
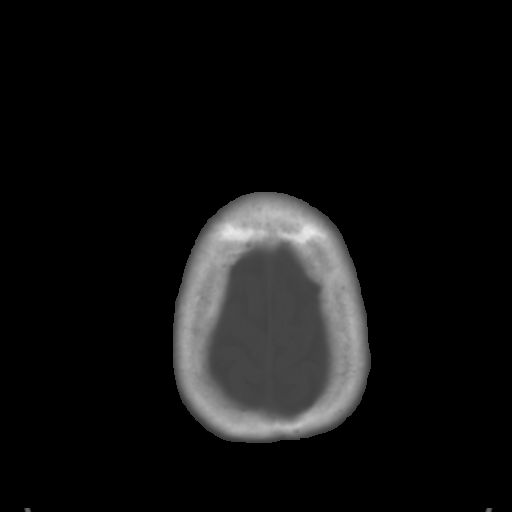

[Series 5: coronal soft tissue · coronal · 0.29mm/px · 3 of 74 slices shown]
[im 25/74  brain]
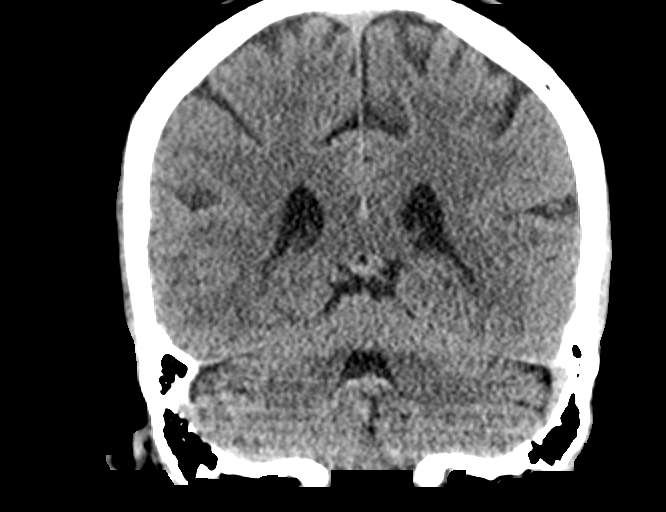
[im 33/74  brain]
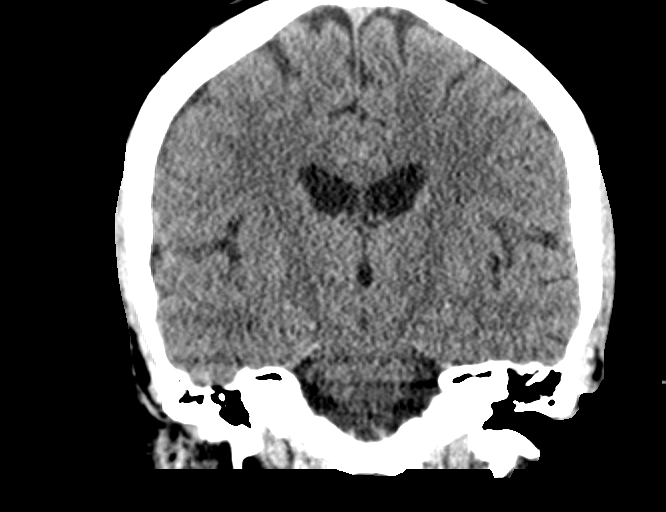
[im 41/74  brain]
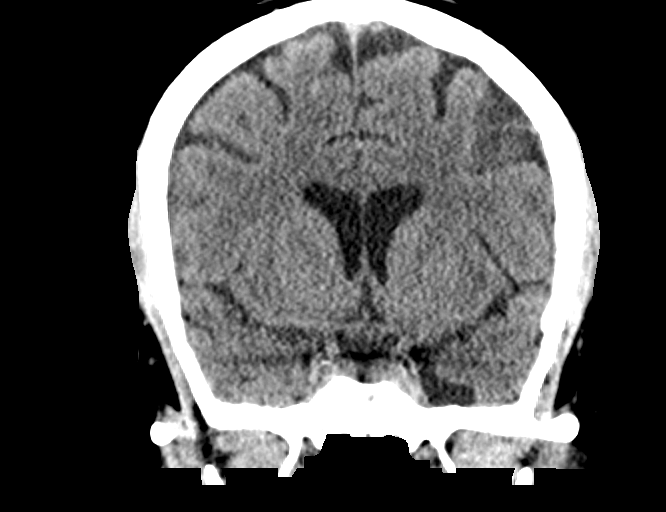

[Series 6: sagittal soft tissue · sagittal · 0.29mm/px · 3 of 58 slices shown]
[im 20/58  brain]
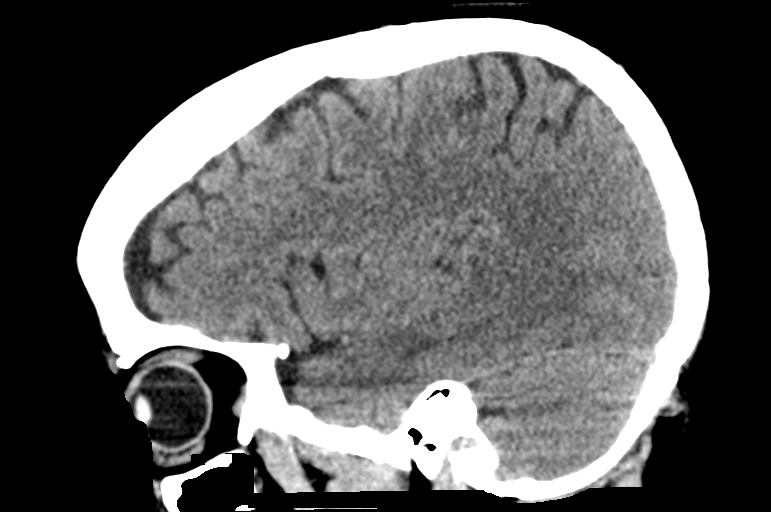
[im 29/58  brain]
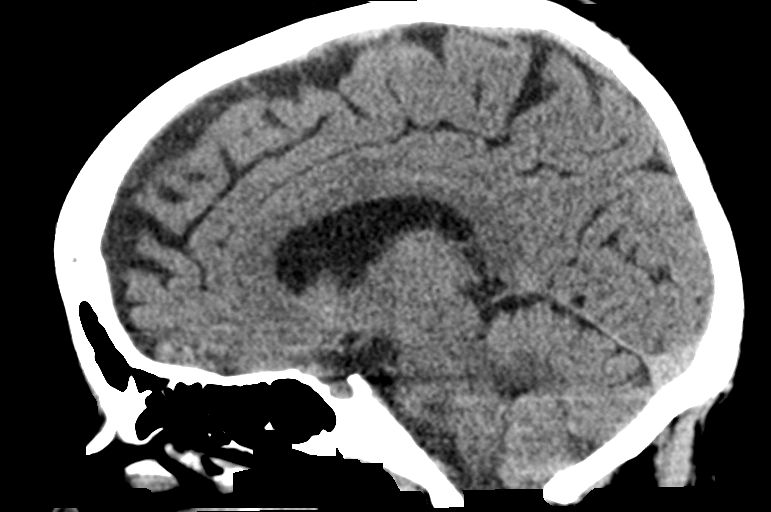
[im 39/58  brain]
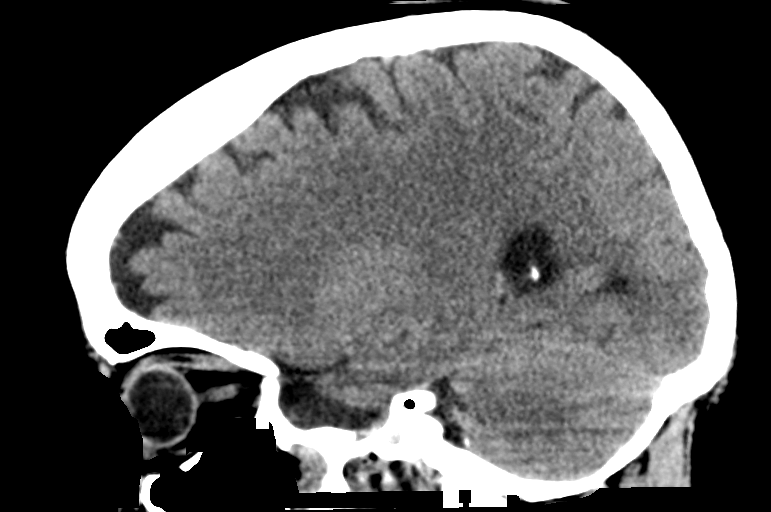

[15 of 47 positions shown; findings below may reference images not displayed]

FINDINGS: Brain: No evidence of acute infarction, hemorrhage, hydrocephalus,
extra-axial collection or mass lesion/mass effect.

Vascular: No hyperdense vessel or unexpected calcification.

Skull: No osseous abnormality.

Sinuses/Orbits: Mild mucosal thickening of the right maxillary
sinus. Visualized mastoid sinuses are clear. Visualized orbits
demonstrate no focal abnormality.

Other: None
IMPRESSION: No acute intracranial pathology.

## 2021-10-06 IMAGING — CT CT ORBITS W/O CM
3 series · 14 of 47 positions shown, 16 images · non-contrast
Comparison: CT head today

CLINICAL DATA: Fall.  Headache.  Head injury.

EXAM:
CT ORBITS WITHOUT CONTRAST
TECHNIQUE: Multidetector CT images were obtained using the standard protocol
without intravenous contrast.

[Series 3: orbits 2.0 h30s st · axial · 0.33mm/px · z∈[-164,-80]mm · 8 of 50 slices shown, 10 images]
[im 4/50  brain]
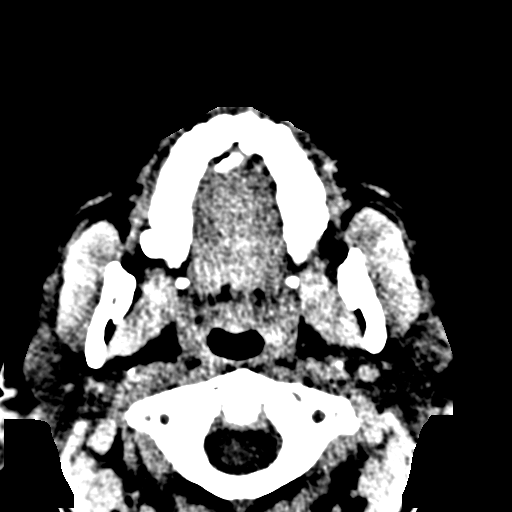
[im 4/50  bone]
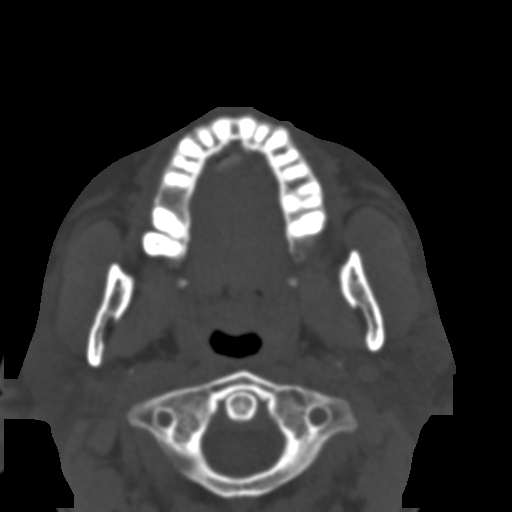
[im 11/50  bone]
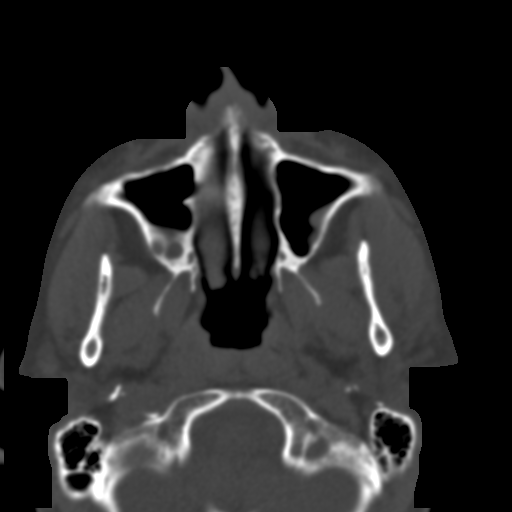
[im 16/50  bone]
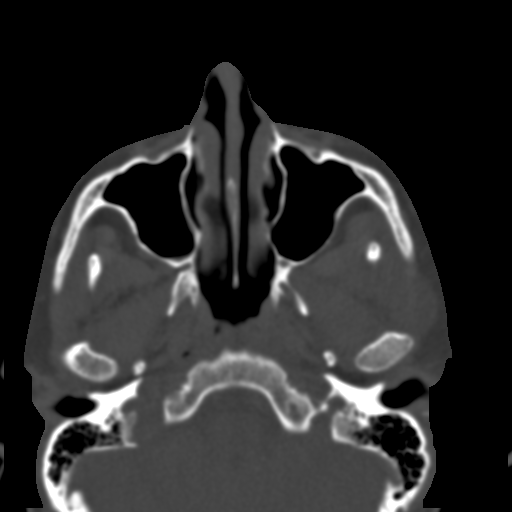
[im 22/50  bone]
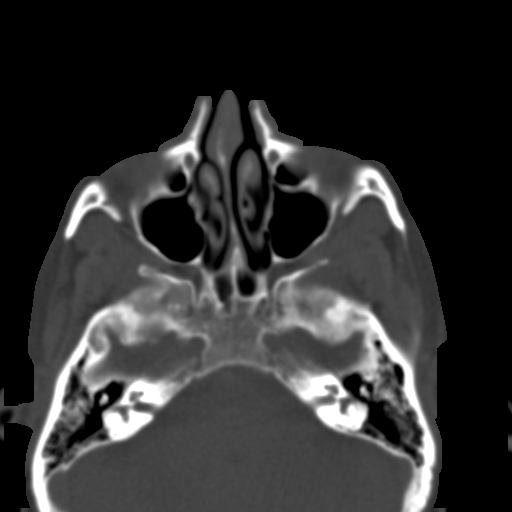
[im 28/50  brain]
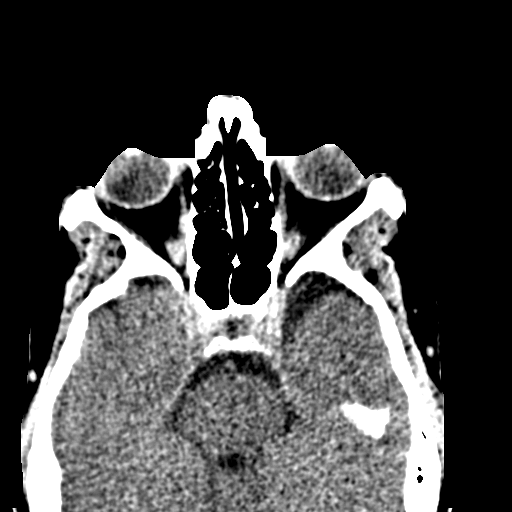
[im 28/50  bone]
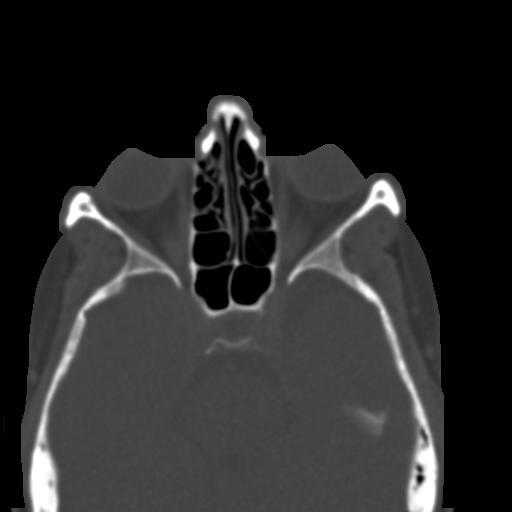
[im 34/50  bone]
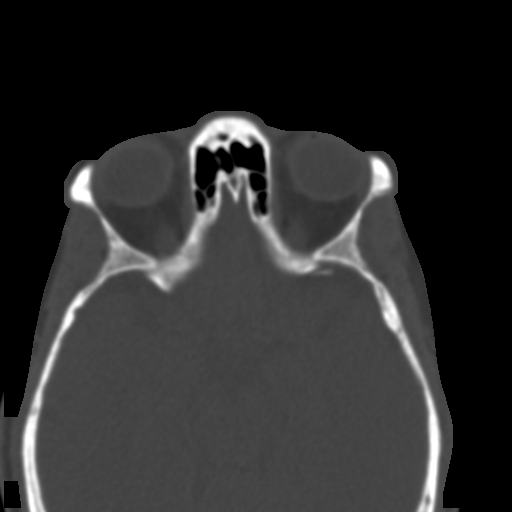
[im 39/50  bone]
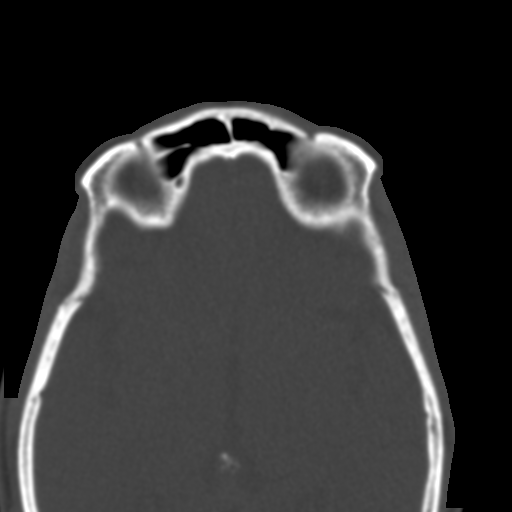
[im 46/50  bone]
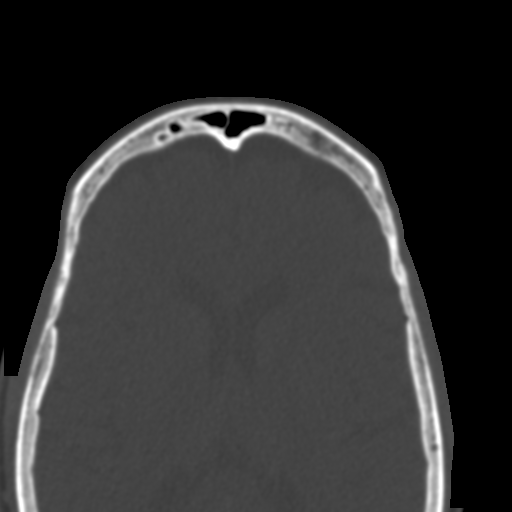

[Series 8: orbits st coronal · coronal · 0.23mm/px · 3 of 72 slices shown]
[im 24/72  bone]
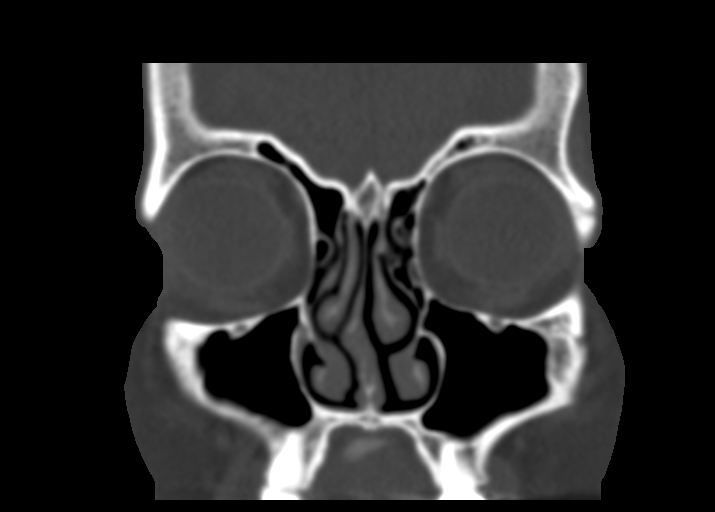
[im 32/72  bone]
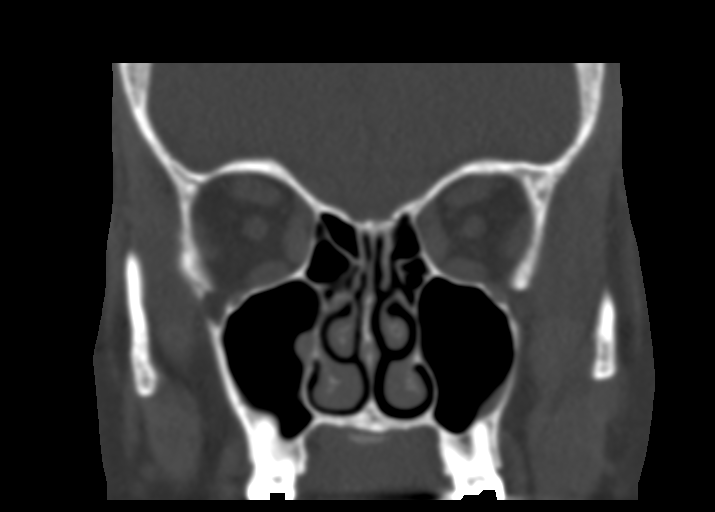
[im 40/72  bone]
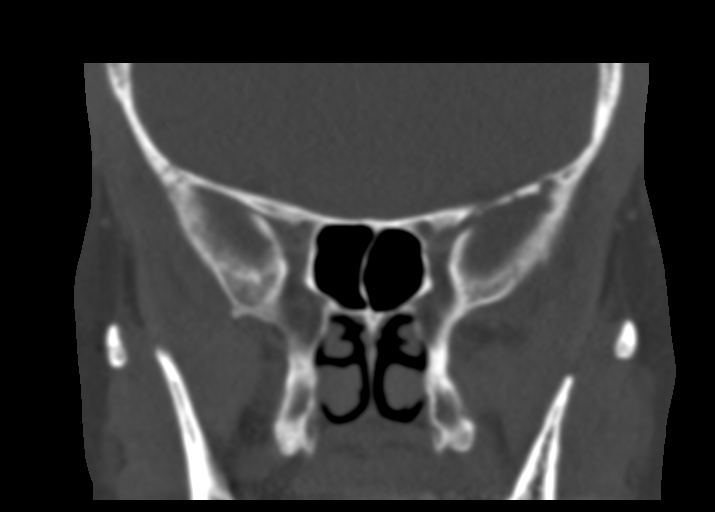

[Series 9: orbits st sagittal · sagittal · 0.20mm/px · 3 of 77 slices shown]
[im 26/77  bone]
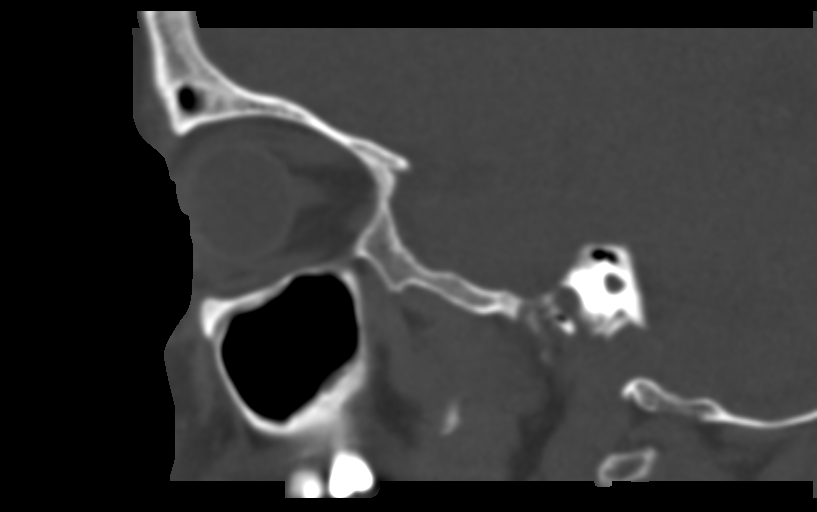
[im 39/77  bone]
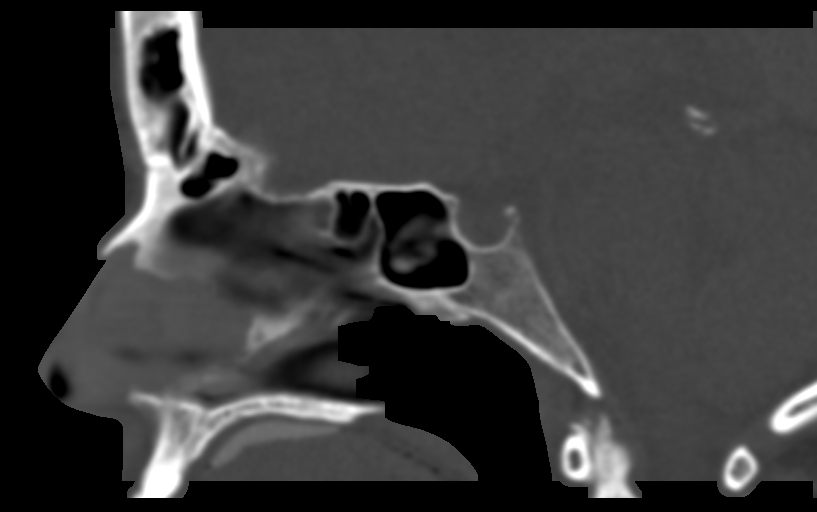
[im 51/77  bone]
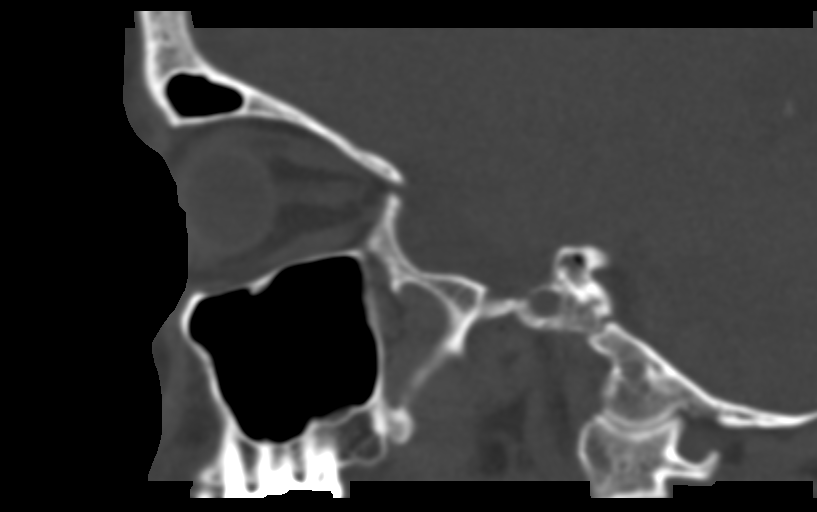

[14 of 47 positions shown; findings below may reference images not displayed]

FINDINGS: Orbits:  No orbital fracture.  No orbital mass or edema.

Visualized sinuses: Mild mucosal edema maxillary sinus bilaterally.
No air-fluid levels. Mastoid clear bilaterally. Middle ear clear
bilaterally.

Soft tissues: No significant soft tissue swelling or mass.

Limited intracranial: Negative

Skeletal: No facial fracture. Nasal bone intact. No fracture of the
orbit. Periapical lucency around right upper third molar.

Moderate degenerative change right TMJ with joint space narrowing
and spurring. Normal left TMJ.
IMPRESSION: Negative for orbital or facial fracture

Periapical lucency around right upper third molar compatible with
infection.

## 2021-10-06 MED ORDER — BUSPIRONE HCL 10 MG PO TABS: ORAL_TABLET | ORAL | 1 refills | Status: DC

## 2021-10-06 NOTE — Patient Instructions (Signed)
Please review the attached list of medications and notify my office if there are any errors.  ? ?Please call the [facility] ([phone]) to schedule a routine screening mammogram. ? ?

## 2021-10-06 NOTE — Progress Notes (Signed)
?  ? ?I,Jana Robinson,acting as a scribe for Lelon Huh, MD.,have documented all relevant documentation on the behalf of Lelon Huh, MD,as directed by  Lelon Huh, MD while in the presence of Lelon Huh, MD. ? ? ?Established patient visit ? ? ?Patient: Olivia Meyer   DOB: 08-21-1965   56 y.o. Female  MRN: [id number] ?Visit Date: 10/06/2021 ? ?Today's healthcare provider: Lelon Huh, MD  ? ?Chief Complaint  ?Patient presents with  ? Anxiety  ? Hypertension  ? Menopause  ? ?Subjective  ?  ?HPI  ?Anxiety, Follow-up ? ?She was last seen for anxiety 4 months ago. ?Changes made at last visit include She was going to work on getting off of alprazolam. Started busPIRone (BUSPAR) 10 MG tablet; Take 1/2 tablet at night  for 7 days, then 1/2 twice a day for 7 days, then 1/2 in the morning and 1 at night, then 1 twice a day.  ?  ?She reports excellent compliance with treatment. ?She reports excellent tolerance of treatment. ?She is not having side effects.  ? ?She feels her anxiety is mild and Improved since last visit. ? ?Symptoms: ?No chest pain No difficulty concentrating  ?No dizziness No fatigue  ?No feelings of losing control No insomnia  ?No irritable No palpitations  ?No panic attacks No racing thoughts  ?No shortness of breath No sweating  ?No tremors/shakes   ? ?GAD-7 Results ? ?  06/27/2021  ?  1:52 PM  ?GAD-7 Generalized Anxiety Disorder Screening Tool  ?1. Feeling Nervous, Anxious, or on Edge 3  ?2. Not Being Able to Stop or Control Worrying 0  ?3. Worrying Too Much About Different Things 3  ?4. Trouble Relaxing 3  ?5. Being So Restless it's Hard To Sit Still 1  ?6. Becoming Easily Annoyed or Irritable 2  ?7. Feeling Afraid As If Something Awful Might Happen 0  ?Total GAD-7 Score 12  ?Difficulty At Work, Home, or Getting  Along With Others? Not difficult at all  ? ? ?PHQ-9 Scores ? ?  10/06/2021  ?  1:23 PM 06/27/2021  ?  1:51 PM 03/09/2018  ? 10:19 AM  ?PHQ9 SCORE ONLY  ?PHQ-9 Total Score '4 7 5   '$ ? ? ?---------------------------------------------------------------------------------------------------  ?Hypertension, follow-up ? ?BP Readings from Last 3 Encounters:  ?10/06/21 (!) 150/92  ?06/27/21 (!) 154/99  ?08/30/20 (!) 158/98  ? Wt Readings from Last 3 Encounters:  ?10/06/21 179 lb 14.4 oz (81.6 kg)  ?06/27/21 179 lb 1.6 oz (81.2 kg)  ?08/30/20 177 lb (80.3 kg)  ?  ? ?She was last seen for hypertension 4 months ago.  ?BP at that visit was 154/99. Management since that visit includes increase amLODipine (NORVASC) 10 MG tablet; Take 1 tablet daily. ? ?She reports excellent compliance with treatment. ?She is not having side effects. ?She is following a  fuits, veg and fish, grains and fats   diet. ?She is exercising. ?She does not smoke. ? ? ? ?Outside blood pressures are does not take  ?Symptoms: ?No chest pain No chest pressure  ?No palpitations No syncope  ?No dyspnea No orthopnea  ?No paroxysmal nocturnal dyspnea No lower extremity edema  ? ?Pertinent labs ?Lab Results  ?Component Value Date  ? CHOL 234 (H) 04/04/2020  ? HDL 62 04/04/2020  ? LDLCALC 136 (H) 04/04/2020  ? TRIG 203 (H) 04/04/2020  ? CHOLHDL 3.8 04/04/2020  ? Lab Results  ?Component Value Date  ? NA 137 08/23/2020  ? K 4.4 08/23/2020  ?  CREATININE 0.67 08/23/2020  ? GFRNONAA 100 08/23/2020  ? GLUCOSE 101 (H) 08/23/2020  ? TSH 1.510 08/31/2016  ?  ? ?The 10-year ASCVD risk score (Arnett DK, et al., 2019) is: 3.9% ? ?---------------------------------------------------------------------------------------------------  ?Follow up for menopausal symptoms  ? ?The patient was last seen for this 4 months ago. ?Changes made at last visit include estradiol (VIVELLE-DOT) 0.05 MG/24HR patch; Place 1 patch (0.05 mg total) onto the skin 2 (two) times a week. ? ?She reports excellent compliance with treatment. ?She feels that condition is Improved. ?She is not having side effects. None   ? ?-----------------------------------------------------------------------------------------  ?Medications: ?Outpatient Medications Prior to Visit  ?Medication Sig  ? amLODipine (NORVASC) 10 MG tablet Take 1 tablet (10 mg total) by mouth daily.  ? busPIRone (BUSPAR) 10 MG tablet Take 1/2 tablet at night  for 7 days, then 1/2 twice a day for 7 days, then 1/2 in the morning and 1 at night, then 1 twice a day. (Patient taking differently: 1/2 at mid-day and 1 at bedtime)  ? estradiol (VIVELLE-DOT) 0.05 MG/24HR patch Place 1 patch (0.05 mg total) onto the skin 2 (two) times a week.  ? POTASSIUM PO Take 500 mg by mouth as needed. Takes 1-2 tablets as needed  ? spironolactone (ALDACTONE) 50 MG tablet Take 1 tablet by mouth once daily  ? ALPRAZolam (XANAX) 0.5 MG tablet Take 1 tablet (0.5 mg total) by mouth 2 (two) times daily as needed. for anxiety (Patient not taking: Reported on 10/06/2021)  ? ?No facility-administered medications prior to visit.  ? ? ? ? ?  Objective  ?  ?BP (!) 150/92 (BP Location: Left Arm, Patient Position: Sitting, Cuff Size: Normal)   Pulse 71   Temp 97.9 ?F (36.6 ?C) (Oral)   Resp 16   Wt 179 lb 14.4 oz (81.6 kg)   SpO2 98%   BMI 31.87 kg/m?  ? ? ?Physical Exam  ?General appearance: Mildly obese female, cooperative and in no acute distress ?Head: Normocephalic, without obvious abnormality, atraumatic ?Respiratory: Respirations even and unlabored, normal respiratory rate ?Extremities: All extremities are intact.  ?Skin: Skin color, texture, turgor normal. No rashes seen  ?Psych: Appropriate mood and affect. ?Neurologic: Mental status: Alert, oriented to person, place, and time, thought content appropriate.  ? ? ? Assessment & Plan  ?  ? ?1. Anxiety ?Doing very well since starting buspirone recently. No longer taking alprazolam. Sent refill busPIRone (BUSPAR) 10 MG tablet; 1/2-1 tablet twice a day  Dispense: 180 tablet; Refill: 1 ? ?2. Essential (primary) hypertension ?Much better since  adding spironolactone. Encouraged to check home blood pressures at least weekly. Same meds for now.  ? ?Follow up in 4 months and anticipate checking lipids, rena, a1c at follow up.  ? ?3. Colon cancer screening ? ?- Cologuard  ?   ? ?The entirety of the information documented in the History of Present Illness, Review of Systems and Physical Exam were personally obtained by me. Portions of this information were initially documented by the CMA and reviewed by me for thoroughness and accuracy.   ? ? ?Lelon Huh, MD  ?[facility] ?[phone] (phone) ?[phone] (fax) ? ?[facility] ?

## 2021-10-22 ENCOUNTER — Encounter: Payer: Self-pay | Admitting: Family Medicine

## 2021-10-22 DIAGNOSIS — N951 Menopausal and female climacteric states: Secondary | ICD-10-CM

## 2021-10-22 MED ORDER — ESTRADIOL 0.05 MG/24HR TD PTTW: 1.0000 | MEDICATED_PATCH | TRANSDERMAL | 11 refills | Status: DC

## 2021-12-07 ENCOUNTER — Other Ambulatory Visit: Payer: Self-pay | Admitting: Family Medicine

## 2021-12-07 DIAGNOSIS — I10 Essential (primary) hypertension: Secondary | ICD-10-CM

## 2022-02-16 ENCOUNTER — Telehealth: Payer: Self-pay | Admitting: Family Medicine

## 2022-02-16 ENCOUNTER — Other Ambulatory Visit: Payer: Self-pay

## 2022-02-16 DIAGNOSIS — I10 Essential (primary) hypertension: Secondary | ICD-10-CM

## 2022-02-16 DIAGNOSIS — F419 Anxiety disorder, unspecified: Secondary | ICD-10-CM

## 2022-02-16 DIAGNOSIS — E2609 Other primary hyperaldosteronism: Secondary | ICD-10-CM

## 2022-02-16 MED ORDER — SPIRONOLACTONE 50 MG PO TABS: 50.0000 mg | ORAL_TABLET | Freq: Every day | ORAL | 1 refills | Status: DC

## 2022-02-16 MED ORDER — BUSPIRONE HCL 10 MG PO TABS: ORAL_TABLET | ORAL | 1 refills | Status: DC

## 2022-02-16 MED ORDER — AMLODIPINE BESYLATE 10 MG PO TABS: 10.0000 mg | ORAL_TABLET | Freq: Every day | ORAL | 1 refills | Status: DC

## 2022-02-16 NOTE — Telephone Encounter (Signed)
[facility] faxed refill request for the following medications:  amLODipine (NORVASC) 10 MG tablet   busPIRone (BUSPAR) 10 MG tablet   Please advise.

## 2022-03-08 ENCOUNTER — Encounter: Payer: Self-pay | Admitting: Family Medicine

## 2022-03-09 ENCOUNTER — Ambulatory Visit: Payer: Medicaid Other | Admitting: Family Medicine

## 2022-03-09 NOTE — Progress Notes (Deleted)
      Established patient visit   Patient: Olivia Meyer   DOB: 09/10/1965   56 y.o. Female  MRN: [id number] Visit Date: 03/09/2022  Today's healthcare provider: Lelon Huh, MD   No chief complaint on file.  Subjective    HPI  Hypertension, follow-up  BP Readings from Last 3 Encounters:  10/06/21 (!) 150/92  06/27/21 (!) 154/99  08/30/20 (!) 158/98   Wt Readings from Last 3 Encounters:  10/06/21 179 lb 14.4 oz (81.6 kg)  06/27/21 179 lb 1.6 oz (81.2 kg)  08/30/20 177 lb (80.3 kg)     She was last seen for hypertension 5 months ago.  BP at that visit was as above. Management since that visit includes none.  She reports {excellent/good/fair/poor:19665} compliance with treatment. She {is/is not:9024} having side effects. {document side effects if present:1} She is following a {diet:21022986} diet. She {is/is not:9024} exercising. She {does/does not:200015} smoke.  Use of agents associated with hypertension: estrogens.   Outside blood pressures are {***enter patient reported home BP readings, or 'not being checked':1}. Symptoms: {Yes/No:20286} chest pain {Yes/No:20286} chest pressure  {Yes/No:20286} palpitations {Yes/No:20286} syncope  {Yes/No:20286} dyspnea {Yes/No:20286} orthopnea  {Yes/No:20286} paroxysmal nocturnal dyspnea {Yes/No:20286} lower extremity edema   Pertinent labs Lab Results  Component Value Date   CHOL 234 (H) 04/04/2020   HDL 62 04/04/2020   LDLCALC 136 (H) 04/04/2020   TRIG 203 (H) 04/04/2020   CHOLHDL 3.8 04/04/2020   Lab Results  Component Value Date   NA 137 08/23/2020   K 4.4 08/23/2020   CREATININE 0.67 08/23/2020   GFRNONAA 100 08/23/2020   GLUCOSE 101 (H) 08/23/2020   TSH 1.510 08/31/2016     The 10-year ASCVD risk score (Arnett DK, et al., 2019) is: 2.9%  ---------------------------------------------------------------------------------------------------   Medications: Outpatient Medications Prior to Visit  Medication Sig    ALPRAZolam (XANAX) 0.5 MG tablet Take 1 tablet (0.5 mg total) by mouth 2 (two) times daily as needed. for anxiety (Patient not taking: Reported on 10/06/2021)   amLODipine (NORVASC) 10 MG tablet Take 1 tablet (10 mg total) by mouth daily.   busPIRone (BUSPAR) 10 MG tablet 1/2-1 tablet twice a day   estradiol (VIVELLE-DOT) 0.05 MG/24HR patch Place 1 patch (0.05 mg total) onto the skin 2 (two) times a week.   POTASSIUM PO Take 500 mg by mouth as needed. Takes 1-2 tablets as needed   spironolactone (ALDACTONE) 50 MG tablet Take 1 tablet (50 mg total) by mouth daily.   No facility-administered medications prior to visit.    Review of Systems  {Labs  Heme  Chem  Endocrine  Serology  Results Review (optional):23779}   Objective    There were no vitals taken for this visit. {Show previous vital signs (optional):23777}  Physical Exam  ***  No results found for any visits on 03/09/22.  Assessment & Plan     ***  No follow-ups on file.      {provider attestation***:1}   Lelon Huh, MD  [facility] [phone] (phone) [phone] (fax)  [place]

## 2022-04-13 ENCOUNTER — Ambulatory Visit (INDEPENDENT_AMBULATORY_CARE_PROVIDER_SITE_OTHER): Payer: 59 | Admitting: Family Medicine

## 2022-04-13 ENCOUNTER — Encounter: Payer: Self-pay | Admitting: Family Medicine

## 2022-04-13 VITALS — BP 122/89 | HR 75 | Temp 98.4°F | Resp 14 | Wt 173.0 lb

## 2022-04-13 DIAGNOSIS — E2609 Other primary hyperaldosteronism: Secondary | ICD-10-CM

## 2022-04-13 DIAGNOSIS — F419 Anxiety disorder, unspecified: Secondary | ICD-10-CM | POA: Diagnosis not present

## 2022-04-13 DIAGNOSIS — I10 Essential (primary) hypertension: Secondary | ICD-10-CM

## 2022-04-13 MED ORDER — SPIRONOLACTONE 100 MG PO TABS: 100.0000 mg | ORAL_TABLET | Freq: Every day | ORAL | 0 refills | Status: DC

## 2022-04-13 NOTE — Progress Notes (Signed)
I,Roshena L Chambers,acting as a scribe for Lelon Huh, MD.,have documented all relevant documentation on the behalf of Lelon Huh, MD,as directed by  Lelon Huh, MD while in the presence of Lelon Huh, MD.     Established patient visit   Patient: Olivia Meyer   DOB: Sep 15, 1965   56 y.o. Female  MRN: [id number] Visit Date: 04/13/2022  Today's healthcare provider: Lelon Huh, MD   Chief Complaint  Patient presents with   Hypertension   Anxiety   Subjective    HPI  Hypertension, follow-up  BP Readings from Last 3 Encounters:  04/13/22 122/89  10/06/21 (!) 150/92  06/27/21 (!) 154/99   Wt Readings from Last 3 Encounters:  04/13/22 173 lb (78.5 kg)  10/06/21 179 lb 14.4 oz (81.6 kg)  06/27/21 179 lb 1.6 oz (81.2 kg)     She was last seen for hypertension 6 months ago.  BP at that visit was 150/92. Management since that visit includes continue same medication.  She reports fair compliance with treatment. Patient states that she titrated her spironolactone from '50mg'$  daily to '150mg'$  daily over the course of a few months to see if it would help with aldosteronism. But she has now been out for the last week. She reports having some leg cramps. She is not having side effects.  She is following a Regular diet. She is exercising. She does not smoke cigarettes, but admits to smoking marijuana.  Use of agents associated with hypertension: none.   Outside blood pressures are 138/80. Symptoms: No chest pain No chest pressure  No palpitations No syncope  No dyspnea No orthopnea  No paroxysmal nocturnal dyspnea No lower extremity edema   Pertinent labs Lab Results  Component Value Date   CHOL 234 (H) 04/04/2020   HDL 62 04/04/2020   LDLCALC 136 (H) 04/04/2020   TRIG 203 (H) 04/04/2020   CHOLHDL 3.8 04/04/2020   Lab Results  Component Value Date   NA 137 08/23/2020   K 4.4 08/23/2020   CREATININE 0.67 08/23/2020   GFRNONAA 100 08/23/2020   GLUCOSE 101  (H) 08/23/2020   TSH 1.510 08/31/2016     The 10-year ASCVD risk score (Arnett DK, et al., 2019) is: 2.6%  ---------------------------------------------------------------------------------------------------   Anxiety, Follow-up  She was last seen for anxiety 6 months ago. Changes made at last visit include continuing same medication.   She reports good compliance with treatment. Patient stopped taking Alprazolam. She is currently only taking Buspar. She also admits to smoking marijuana to help reduce anxiety.  She reports good tolerance of treatment. She is not having side effects.   She feels her anxiety is mild and Improved since last visit.  Symptoms: No chest pain No difficulty concentrating  No dizziness No fatigue  No feelings of losing control No insomnia  No irritable No palpitations  No panic attacks No racing thoughts  No shortness of breath No sweating  No tremors/shakes    GAD-7 Results    06/27/2021    1:52 PM  GAD-7 Generalized Anxiety Disorder Screening Tool  1. Feeling Nervous, Anxious, or on Edge 3  2. Not Being Able to Stop or Control Worrying 0  3. Worrying Too Much About Different Things 3  4. Trouble Relaxing 3  5. Being So Restless it's Hard To Sit Still 1  6. Becoming Easily Annoyed or Irritable 2  7. Feeling Afraid As If Something Awful Might Happen 0  Total GAD-7 Score 12  Difficulty At  Work, Home, or Getting  Along With Others? Not difficult at all    PHQ-9 Scores    10/06/2021    1:23 PM 06/27/2021    1:51 PM 03/09/2018   10:19 AM  PHQ9 SCORE ONLY  PHQ-9 Total Score '4 7 5    '$ ---------------------------------------------------------------------------------------------------   Medications: Outpatient Medications Prior to Visit  Medication Sig   amLODipine (NORVASC) 10 MG tablet Take 1 tablet (10 mg total) by mouth daily.   busPIRone (BUSPAR) 10 MG tablet 1/2-1 tablet twice a day   POTASSIUM PO Take 500 mg by mouth as needed. Takes  1-2 tablets as needed   spironolactone (ALDACTONE) 50 MG tablet Take 1 tablet (50 mg total) by mouth daily.   ALPRAZolam (XANAX) 0.5 MG tablet Take 1 tablet (0.5 mg total) by mouth 2 (two) times daily as needed. for anxiety (Patient not taking: Reported on 10/06/2021)   estradiol (VIVELLE-DOT) 0.05 MG/24HR patch Place 1 patch (0.05 mg total) onto the skin 2 (two) times a week.   No facility-administered medications prior to visit.    Review of Systems  Constitutional:  Negative for appetite change, chills, fatigue and fever.  Respiratory:  Negative for chest tightness and shortness of breath.   Cardiovascular:  Negative for chest pain and palpitations.  Gastrointestinal:  Negative for abdominal pain, nausea and vomiting.  Neurological:  Negative for dizziness and weakness.       Objective    BP 122/89 (BP Location: Right Arm, Patient Position: Sitting, Cuff Size: Large)   Pulse 75   Temp 98.4 F (36.9 C) (Oral)   Resp 14   Wt 173 lb (78.5 kg)   SpO2 100% Comment: room air  BMI 30.65 kg/m    Physical Exam   General: Appearance:    Mildly obese female in no acute distress  Eyes:    PERRL, conjunctiva/corneas clear, EOM's intact       Lungs:     Clear to auscultation bilaterally, respirations unlabored  Heart:    Normal heart rate. Normal rhythm. No murmurs, rubs, or gallops.    MS:   All extremities are intact.    Neurologic:   Awake, alert, oriented x 3. No apparent focal neurological defect.         Assessment & Plan    1. Essential (primary) hypertension Strongly advised not to take it upon herself to adjust medications without doctor supervision and that excessive doses of spironolactone can cause hyperkalemia and fatal arrhythmias.   She has now been out of medication since she was taking 3 x '50mg'$  a day for several weeks. Will restart at '100mg'$  with strict instructions to have labs done within next two weeks.   - spironolactone (ALDACTONE) 100 MG tablet; Take 1 tablet  (100 mg total) by mouth daily.  Dispense: 30 tablet; Refill: 0 - Comprehensive metabolic panel; Future - Lipid panel; Future - CBC; Future  2. Primary aldosteronism (HCC)  - spironolactone (ALDACTONE) 100 MG tablet; Take 1 tablet (100 mg total) by mouth daily.  Dispense: 30 tablet; Refill: 0  3. Anxiety Doing much better with buspirone and no longer needing to take alprazolam.       The entirety of the information documented in the History of Present Illness, Review of Systems and Physical Exam were personally obtained by me. Portions of this information were initially documented by the CMA and reviewed by me for thoroughness and accuracy. Lelon Huh, MD  [facility] [phone] (phone) [phone] (  fax)  [place]

## 2022-05-18 ENCOUNTER — Other Ambulatory Visit: Payer: Self-pay

## 2022-05-18 DIAGNOSIS — I10 Essential (primary) hypertension: Secondary | ICD-10-CM

## 2022-05-18 NOTE — Progress Notes (Signed)
Created in error

## 2022-06-08 ENCOUNTER — Encounter: Payer: Self-pay | Admitting: Family Medicine

## 2022-06-08 ENCOUNTER — Other Ambulatory Visit: Payer: Self-pay

## 2022-06-08 DIAGNOSIS — I10 Essential (primary) hypertension: Secondary | ICD-10-CM

## 2022-06-08 DIAGNOSIS — E2609 Other primary hyperaldosteronism: Secondary | ICD-10-CM

## 2022-06-08 DIAGNOSIS — F419 Anxiety disorder, unspecified: Secondary | ICD-10-CM

## 2022-06-08 MED ORDER — AMLODIPINE BESYLATE 10 MG PO TABS: 10.0000 mg | ORAL_TABLET | Freq: Every day | ORAL | 1 refills | Status: DC

## 2022-06-08 MED ORDER — SPIRONOLACTONE 100 MG PO TABS: 100.0000 mg | ORAL_TABLET | Freq: Every day | ORAL | 0 refills | Status: DC

## 2022-06-08 MED ORDER — BUSPIRONE HCL 10 MG PO TABS: ORAL_TABLET | ORAL | 1 refills | Status: DC

## 2022-06-09 LAB — CBC
Hematocrit: 46.3 % (ref 34.0–46.6)
Hemoglobin: 15.8 g/dL (ref 11.1–15.9)
MCH: 30.7 pg (ref 26.6–33.0)
MCHC: 34.1 g/dL (ref 31.5–35.7)
MCV: 90 fL (ref 79–97)
Platelets: 336 10*3/uL (ref 150–450)
RBC: 5.15 x10E6/uL (ref 3.77–5.28)
RDW: 11.9 % (ref 11.7–15.4)
WBC: 10.4 10*3/uL (ref 3.4–10.8)

## 2022-06-09 LAB — COMPREHENSIVE METABOLIC PANEL
ALT: 47 IU/L — ABNORMAL HIGH (ref 0–32)
AST: 27 IU/L (ref 0–40)
Albumin/Globulin Ratio: 1.7 (ref 1.2–2.2)
Albumin: 4.9 g/dL (ref 3.8–4.9)
Alkaline Phosphatase: 63 IU/L (ref 44–121)
BUN/Creatinine Ratio: 17 (ref 9–23)
BUN: 16 mg/dL (ref 6–24)
Bilirubin Total: 0.5 mg/dL (ref 0.0–1.2)
CO2: 22 mmol/L (ref 20–29)
Calcium: 10.3 mg/dL — ABNORMAL HIGH (ref 8.7–10.2)
Chloride: 102 mmol/L (ref 96–106)
Creatinine, Ser: 0.94 mg/dL (ref 0.57–1.00)
Globulin, Total: 2.9 g/dL (ref 1.5–4.5)
Glucose: 127 mg/dL — ABNORMAL HIGH (ref 70–99)
Potassium: 5 mmol/L (ref 3.5–5.2)
Sodium: 139 mmol/L (ref 134–144)
Total Protein: 7.8 g/dL (ref 6.0–8.5)
eGFR: 71 mL/min/{1.73_m2} (ref >59)

## 2022-06-09 LAB — LIPID PANEL
Chol/HDL Ratio: 3.7 ratio (ref 0.0–4.4)
Cholesterol, Total: 241 mg/dL — ABNORMAL HIGH (ref 100–199)
HDL: 65 mg/dL (ref >39)
LDL Chol Calc (NIH): 130 mg/dL — ABNORMAL HIGH (ref 0–99)
Triglycerides: 263 mg/dL — ABNORMAL HIGH (ref 0–149)
VLDL Cholesterol Cal: 46 mg/dL — ABNORMAL HIGH (ref 5–40)

## 2022-06-12 NOTE — Telephone Encounter (Signed)
Ok to add TSH and free T4 for hypertriglyceridemia

## 2022-06-19 LAB — TSH+FREE T4
Free T4: 0.99 ng/dL (ref 0.82–1.77)
TSH: 1.32 u[IU]/mL (ref 0.450–4.500)

## 2022-06-19 LAB — SPECIMEN STATUS REPORT

## 2022-07-20 ENCOUNTER — Encounter: Payer: Self-pay | Admitting: Family Medicine

## 2022-07-20 ENCOUNTER — Other Ambulatory Visit: Payer: Self-pay | Admitting: Family Medicine

## 2022-07-20 DIAGNOSIS — I10 Essential (primary) hypertension: Secondary | ICD-10-CM

## 2022-07-20 DIAGNOSIS — E2609 Other primary hyperaldosteronism: Secondary | ICD-10-CM

## 2022-07-21 ENCOUNTER — Other Ambulatory Visit: Payer: Self-pay

## 2022-07-21 DIAGNOSIS — I10 Essential (primary) hypertension: Secondary | ICD-10-CM

## 2022-07-21 DIAGNOSIS — E2609 Other primary hyperaldosteronism: Secondary | ICD-10-CM

## 2022-07-21 MED ORDER — AMLODIPINE BESYLATE 10 MG PO TABS: 10.0000 mg | ORAL_TABLET | Freq: Every day | ORAL | 1 refills | Status: DC

## 2022-07-21 MED ORDER — SPIRONOLACTONE 100 MG PO TABS: 100.0000 mg | ORAL_TABLET | Freq: Every day | ORAL | 1 refills | Status: DC

## 2022-08-06 LAB — RESULTS CONSOLE HPV: CHL HPV: NEGATIVE

## 2022-08-06 LAB — HM PAP SMEAR: HM Pap smear: NORMAL

## 2022-09-20 ENCOUNTER — Encounter: Payer: Self-pay | Admitting: Family Medicine

## 2022-09-24 ENCOUNTER — Other Ambulatory Visit: Payer: Self-pay

## 2022-09-24 ENCOUNTER — Encounter: Payer: Self-pay | Admitting: Family Medicine

## 2022-09-24 DIAGNOSIS — E2609 Other primary hyperaldosteronism: Secondary | ICD-10-CM

## 2022-09-24 DIAGNOSIS — I10 Essential (primary) hypertension: Secondary | ICD-10-CM

## 2022-09-24 NOTE — Telephone Encounter (Signed)
LOV 04/13/22 NOV none LRF 07/21/22 30 x 1

## 2022-09-25 MED ORDER — SPIRONOLACTONE 100 MG PO TABS: 100.0000 mg | ORAL_TABLET | Freq: Every day | ORAL | 1 refills | Status: DC

## 2022-10-22 ENCOUNTER — Other Ambulatory Visit: Payer: Self-pay | Admitting: Family Medicine

## 2022-10-22 DIAGNOSIS — E2609 Other primary hyperaldosteronism: Secondary | ICD-10-CM

## 2022-10-22 DIAGNOSIS — I10 Essential (primary) hypertension: Secondary | ICD-10-CM

## 2022-10-22 NOTE — Telephone Encounter (Signed)
Called pt - left message to return call and schedule appt.

## 2022-10-22 NOTE — Telephone Encounter (Signed)
Requested Prescriptions  Refused Prescriptions Disp Refills   spironolactone (ALDACTONE) 100 MG tablet [Pharmacy Med Name: Spironolactone 100 MG Oral Tablet] 30 tablet 0    Sig: Take 1 tablet by mouth once daily     Cardiovascular: Diuretics - Aldosterone Antagonist Failed - 10/22/2022 10:16 AM      Failed - Valid encounter within last 6 months    Recent Outpatient Visits           6 months ago Essential (primary) hypertension   [facility] Malva Limes, MD   1 year ago Anxiety   [facility] Malva Limes, MD   1 year ago Breast cancer screening by mammogram   [facility] Malva Limes, MD   2 years ago Essential (primary) hypertension   [facility] Malva Limes, MD   2 years ago Hypertriglyceridemia   [facility] Malva Limes, MD              Passed - Cr in normal range and within 180 days    Creatinine, Ser  Date Value Ref Range Status  06/08/2022 0.94 0.57 - 1.00 mg/dL Final         Passed - K in normal range and within 180 days    Potassium  Date Value Ref Range Status  06/08/2022 5.0 3.5 - 5.2 mmol/L Final         Passed - Na in normal range and within 180 days    Sodium  Date Value Ref Range Status  06/08/2022 139 134 - 144 mmol/L Final         Passed - eGFR is 30 or above and within 180 days    GFR calc Af Amer  Date Value Ref Range Status  08/23/2020 115 >59 mL/min/1.73 Final    Comment:    **In accordance with recommendations from the NKF-ASN Task force,**   [facility] is in the process of updating its eGFR calculation to the   2021 CKD-EPI creatinine equation that estimates kidney function   without a race variable.    GFR calc non Af Amer  Date Value Ref Range Status  08/23/2020 100 >59 mL/min/1.73 Final   eGFR  Date Value Ref Range Status  06/08/2022 71 >59 mL/min/1.73 Final         Passed -  Last BP in normal range    BP Readings from Last 1 Encounters:  04/13/22 122/89

## 2022-10-23 ENCOUNTER — Encounter: Payer: Self-pay | Admitting: Family Medicine

## 2022-10-23 DIAGNOSIS — I10 Essential (primary) hypertension: Secondary | ICD-10-CM

## 2022-10-23 DIAGNOSIS — E2609 Other primary hyperaldosteronism: Secondary | ICD-10-CM

## 2022-10-26 MED ORDER — SPIRONOLACTONE 100 MG PO TABS: 100.0000 mg | ORAL_TABLET | Freq: Every day | ORAL | 0 refills | Status: DC

## 2022-11-19 NOTE — Progress Notes (Deleted)
Established patient visit   Patient: Olivia Meyer   DOB: 1965-12-16   57 y.o. Female  MRN: [id number] Visit Date: 11/20/2022  Today's healthcare provider: Mila Merry, MD   No chief complaint on file.  Subjective    HPI  Hypertension, follow-up  BP Readings from Last 3 Encounters:  04/13/22 122/89  10/06/21 (!) 150/92  06/27/21 (!) 154/99   Wt Readings from Last 3 Encounters:  04/13/22 173 lb (78.5 kg)  10/06/21 179 lb 14.4 oz (81.6 kg)  06/27/21 179 lb 1.6 oz (81.2 kg)     She was last seen for hypertension 6 months ago.  BP at that visit was 122/89. Management since that visit includes restart spironolactone 100 mg daily. Patient advised not to change medications without doctor supervision.  She reports {excellent/good/fair/poor:19665} compliance with treatment. She {is/is not:9024} having side effects. {document side effects if present:1}  Outside blood pressures are {***enter patient reported home BP readings, or 'not being checked':1}.   Pertinent labs Lab Results  Component Value Date   CHOL 241 (H) 06/08/2022   HDL 65 06/08/2022   LDLCALC 130 (H) 06/08/2022   TRIG 263 (H) 06/08/2022   CHOLHDL 3.7 06/08/2022   Lab Results  Component Value Date   NA 139 06/08/2022   K 5.0 06/08/2022   CREATININE 0.94 06/08/2022   EGFR 71 06/08/2022   GLUCOSE 127 (H) 06/08/2022   TSH 1.320 06/08/2022     The 10-year ASCVD risk score (Arnett DK, et al., 2019) is: 2.8%  --------------------------------------------------------------------------------------------------- Anxiety, Follow-up  She was last seen for anxiety 6 months ago. Changes made at last visit include no changes.   She reports {excellent/good/fair/poor:19665} compliance with treatment. She reports {good/fair/poor:18685} tolerance of treatment. She {is/is not:21021397} having side effects. {document side effects if present:1}  She feels her anxiety is {Desc; severity:60313} and  {improved/worse/unchanged:3041574} since last visit.  GAD-7 Results    06/27/2021    1:52 PM  GAD-7 Generalized Anxiety Disorder Screening Tool  1. Feeling Nervous, Anxious, or on Edge 3  2. Not Being Able to Stop or Control Worrying 0  3. Worrying Too Much About Different Things 3  4. Trouble Relaxing 3  5. Being So Restless it's Hard To Sit Still 1  6. Becoming Easily Annoyed or Irritable 2  7. Feeling Afraid As If Something Awful Might Happen 0  Total GAD-7 Score 12  Difficulty At Work, Home, or Getting  Along With Others? Not difficult at all    PHQ-9 Scores    10/06/2021    1:23 PM 06/27/2021    1:51 PM 03/09/2018   10:19 AM  PHQ9 SCORE ONLY  PHQ-9 Total Score 4 7 5     ---------------------------------------------------------------------------------------------------   Medications: Outpatient Medications Prior to Visit  Medication Sig   amLODipine (NORVASC) 10 MG tablet Take 1 tablet (10 mg total) by mouth daily.   busPIRone (BUSPAR) 10 MG tablet 1/2-1 tablet twice a day   spironolactone (ALDACTONE) 100 MG tablet Take 1 tablet (100 mg total) by mouth daily. PATIENT NEEDS OFFICE VISIT FOR FOLLOW UP   No facility-administered medications prior to visit.    Review of Systems  {Labs  Heme  Chem  Endocrine  Serology  Results Review (optional):23779}   Objective    There were no vitals taken for this visit. {Show previous vital signs (optional):23777}  Physical Exam  ***  No results found for any visits on 11/20/22.  Assessment & Plan     ***  No follow-ups on file.      {provider attestation***:1}   Mila Merry, MD  [facility] [phone] (phone) [phone] (fax)  [facility]

## 2022-11-20 ENCOUNTER — Ambulatory Visit: Payer: 59 | Admitting: Family Medicine

## 2022-11-27 ENCOUNTER — Other Ambulatory Visit: Payer: Self-pay | Admitting: Family Medicine

## 2022-11-27 DIAGNOSIS — E2609 Other primary hyperaldosteronism: Secondary | ICD-10-CM

## 2022-11-27 DIAGNOSIS — I10 Essential (primary) hypertension: Secondary | ICD-10-CM

## 2022-11-27 MED ORDER — SPIRONOLACTONE 100 MG PO TABS: 100.0000 mg | ORAL_TABLET | Freq: Every day | ORAL | 0 refills | Status: DC

## 2022-12-09 ENCOUNTER — Ambulatory Visit: Payer: 59 | Admitting: Family Medicine

## 2022-12-09 VITALS — BP 128/98 | HR 66 | Wt 161.0 lb

## 2022-12-09 DIAGNOSIS — I10 Essential (primary) hypertension: Secondary | ICD-10-CM

## 2022-12-09 DIAGNOSIS — E781 Pure hyperglyceridemia: Secondary | ICD-10-CM | POA: Diagnosis not present

## 2022-12-09 DIAGNOSIS — E2609 Other primary hyperaldosteronism: Secondary | ICD-10-CM

## 2022-12-09 DIAGNOSIS — R7303 Prediabetes: Secondary | ICD-10-CM

## 2022-12-09 MED ORDER — AMLODIPINE BESYLATE 10 MG PO TABS: 10.0000 mg | ORAL_TABLET | Freq: Every day | ORAL | 2 refills | Status: DC

## 2022-12-09 MED ORDER — SPIRONOLACTONE 100 MG PO TABS: 100.0000 mg | ORAL_TABLET | Freq: Every day | ORAL | 0 refills | Status: DC

## 2022-12-09 NOTE — Progress Notes (Signed)
Vivien Rota DeSanto,acting as a scribe for Mila Merry, MD.,have documented all relevant documentation on the behalf of Mila Merry, MD,as directed by  Mila Merry, MD      Established patient visit   Patient: Olivia Meyer   DOB: June 24, 1966   57 y.o. Female  MRN: [id number] Visit Date: 12/09/2022  Today's healthcare provider: Mila Merry, MD   Chief Complaint  Patient presents with   Hyperlipidemia   Hypertension   Prediabetes   Subjective    HPI  Hypertension, follow-up  BP Readings from Last 3 Encounters:  12/09/22 (!) 128/98  04/13/22 122/89  10/06/21 (!) 150/92   Wt Readings from Last 3 Encounters:  12/09/22 161 lb (73 kg)  04/13/22 173 lb (78.5 kg)  10/06/21 179 lb 14.4 oz (81.6 kg)     She was last seen for hypertension 8 months ago.  BP at that visit was as above. Management since that visit includes no changes.  She reports good compliance with treatment.  Except the last 2 days she has been without Amlodipine. She is not having side effects.  She is following a  Mediterranean  diet. She is exercising. She does not smoke.  Use of agents associated with hypertension: none.   Outside blood pressures are not being checked. Symptoms: No chest pain No chest pressure  No palpitations No syncope  No dyspnea No orthopnea  No paroxysmal nocturnal dyspnea No lower extremity edema   Pertinent labs Lab Results  Component Value Date   CHOL 241 (H) 06/08/2022   HDL 65 06/08/2022   LDLCALC 130 (H) 06/08/2022   TRIG 263 (H) 06/08/2022   CHOLHDL 3.7 06/08/2022   Lab Results  Component Value Date   NA 139 06/08/2022   K 5.0 06/08/2022   CREATININE 0.94 06/08/2022   EGFR 71 06/08/2022   GLUCOSE 127 (H) 06/08/2022   TSH 1.320 06/08/2022     The 10-year ASCVD risk score (Arnett DK, et al., 2019) is: 3.1%  ---------------------------------------------------------------------------------------------------   Medications: Outpatient Medications Prior  to Visit  Medication Sig   amLODipine (NORVASC) 10 MG tablet Take 1 tablet (10 mg total) by mouth daily.   busPIRone (BUSPAR) 10 MG tablet 1/2-1 tablet twice a day   spironolactone (ALDACTONE) 100 MG tablet Take 1 tablet (100 mg total) by mouth daily. PATIENT NEEDS OFFICE VISIT FOR FOLLOW UP   No facility-administered medications prior to visit.    Review of Systems     Objective    BP (!) 128/98 (BP Location: Right Arm, Patient Position: Sitting, Cuff Size: Normal)   Pulse 66   Wt 161 lb (73 kg)   SpO2 97%   BMI 28.52 kg/m  Vitals:   12/09/22 0949 12/09/22 0951  BP: (!) 145/92 (!) 128/98  Pulse: 66   SpO2: 97%   Weight: 161 lb (73 kg)    Physical Exam  General appearance: Well developed, well nourished female, cooperative and in no acute distress Head: Normocephalic, without obvious abnormality, atraumatic Respiratory: Respirations even and unlabored, normal respiratory rate Extremities: All extremities are intact.  Skin: Skin color, texture, turgor normal. No rashes seen  Psych: Appropriate mood and affect. Neurologic: Mental status: Alert, oriented to person, place, and time, thought content appropriate.     Assessment & Plan     1. Essential (primary) hypertension Mildly elevated, but aout of amlodipine last few days. Home Bps well controlled.  - amLODipine (NORVASC) 10 MG tablet; Take 1 tablet (10 mg total) by mouth daily.  Dispense: 90 tablet; Refill: 2 - spironolactone (ALDACTONE) 100 MG tablet; Take 1 tablet (100 mg total) by mouth daily.  Dispense: 90 tablet; Refill: 0  2. Pre-diabetes  - Renal function panel - Hemoglobin A1c  3. Hypertriglyceridemia Stable   4. Primary aldosteronism (HCC) refill spironolactone (ALDACTONE) 100 MG tablet; Take 1 tablet (100 mg total) by mouth daily.  Dispense: 90 tablet; Refill: 0      The entirety of the information documented in the History of Present Illness, Review of Systems and Physical Exam were personally  obtained by me. Portions of this information were initially documented by the CMA and reviewed by me for thoroughness and accuracy.     Mila Merry, MD  [facility] [phone] (phone) [phone] (fax)  [facility]

## 2023-03-31 ENCOUNTER — Other Ambulatory Visit: Payer: Self-pay | Admitting: Family Medicine

## 2023-03-31 ENCOUNTER — Encounter: Payer: Self-pay | Admitting: Family Medicine

## 2023-03-31 DIAGNOSIS — Z1212 Encounter for screening for malignant neoplasm of rectum: Secondary | ICD-10-CM

## 2023-03-31 DIAGNOSIS — F419 Anxiety disorder, unspecified: Secondary | ICD-10-CM

## 2023-03-31 DIAGNOSIS — Z1211 Encounter for screening for malignant neoplasm of colon: Secondary | ICD-10-CM

## 2023-03-31 MED ORDER — BUSPIRONE HCL 10 MG PO TABS
ORAL_TABLET | ORAL | 0 refills | Status: DC
Start: 2023-03-31 — End: 2023-10-25

## 2023-04-02 ENCOUNTER — Other Ambulatory Visit: Payer: Self-pay | Admitting: Family Medicine

## 2023-04-02 DIAGNOSIS — Z1211 Encounter for screening for malignant neoplasm of colon: Secondary | ICD-10-CM

## 2023-04-02 DIAGNOSIS — Z1212 Encounter for screening for malignant neoplasm of rectum: Secondary | ICD-10-CM

## 2023-04-06 ENCOUNTER — Other Ambulatory Visit: Payer: Self-pay | Admitting: Family Medicine

## 2023-04-06 DIAGNOSIS — I10 Essential (primary) hypertension: Secondary | ICD-10-CM

## 2023-04-06 DIAGNOSIS — E2609 Other primary hyperaldosteronism: Secondary | ICD-10-CM

## 2023-04-26 ENCOUNTER — Encounter: Payer: Self-pay | Admitting: Family Medicine

## 2023-04-26 DIAGNOSIS — E781 Pure hyperglyceridemia: Secondary | ICD-10-CM

## 2023-04-26 DIAGNOSIS — A09 Infectious gastroenteritis and colitis, unspecified: Secondary | ICD-10-CM

## 2023-05-07 ENCOUNTER — Encounter: Payer: Self-pay | Admitting: Family Medicine

## 2023-05-07 ENCOUNTER — Ambulatory Visit (INDEPENDENT_AMBULATORY_CARE_PROVIDER_SITE_OTHER): Payer: Medicaid Other | Admitting: Family Medicine

## 2023-05-07 VITALS — BP 143/94 | HR 65 | Ht 63.0 in | Wt 160.0 lb

## 2023-05-07 DIAGNOSIS — E2609 Other primary hyperaldosteronism: Secondary | ICD-10-CM | POA: Diagnosis not present

## 2023-05-07 DIAGNOSIS — R7301 Impaired fasting glucose: Secondary | ICD-10-CM | POA: Diagnosis not present

## 2023-05-07 DIAGNOSIS — Z01818 Encounter for other preprocedural examination: Secondary | ICD-10-CM

## 2023-05-07 DIAGNOSIS — I10 Essential (primary) hypertension: Secondary | ICD-10-CM

## 2023-05-07 DIAGNOSIS — R7303 Prediabetes: Secondary | ICD-10-CM | POA: Diagnosis not present

## 2023-05-07 DIAGNOSIS — E781 Pure hyperglyceridemia: Secondary | ICD-10-CM | POA: Diagnosis not present

## 2023-05-07 NOTE — Progress Notes (Signed)
      Established patient visit   Patient: Olivia Meyer   DOB: 1966-03-18   57 y.o. Female  MRN: [id number] Visit Date: 05/07/2023  Today's healthcare provider: Mila Merry, MD   No chief complaint on file.  Subjective    HPI  Patient presents for preop labs required [place] She has no complaints today. Has no history of surgical or anesthetic complications. She continues on amlodipine and spironolactone for hypertension which she is tolerating well. Resting home blood pressures typically in the 120s/80s, but diastolic go into the 90s after physical exertio.   Medications: Outpatient Medications Prior to Visit  Medication Sig   amLODipine (NORVASC) 10 MG tablet Take 1 tablet (10 mg total) by mouth daily.   busPIRone (BUSPAR) 10 MG tablet 1/2-1 tablet twice a day   spironolactone (ALDACTONE) 100 MG tablet Take 1 tablet by mouth once daily   No facility-administered medications prior to visit.    Review of Systems  Constitutional:  Negative for appetite change, chills, fatigue and fever.  Respiratory:  Negative for chest tightness and shortness of breath.   Cardiovascular:  Negative for chest pain and palpitations.  Gastrointestinal:  Negative for abdominal pain, nausea and vomiting.  Neurological:  Negative for dizziness and weakness.       Objective    BP (!) 143/94   Pulse 65   Ht 5\' 3"  (1.6 m)   Wt 160 lb (72.6 kg)   SpO2 97%   BMI 28.34 kg/m    Physical Exam  General appearance: Well developed, well nourished female, cooperative and in no acute distress Head: Normocephalic, without obvious abnormality, atraumatic Respiratory: Respirations even and unlabored, normal respiratory rate Extremities: All extremities are intact.  Skin: Skin color, texture, turgor normal. No rashes seen  Psych: Appropriate mood and affect. Neurologic: Mental status: Alert, oriented to person, place, and time, thought content  appropriate.   Assessment & Plan     1. Preop testing  - CBC - TSH - T4, free - ABO AND RH  - Renal function panel - Hepatic function panel - PT and PTT  2. Essential (primary) hypertension Well controlled.  Continue current medications.   - Lipid panel  3. Primary aldosteronism (HCC)  - Renal function panel  4. Hypertriglyceridemia          Mila Merry, MD  [facility] [phone] (phone) [phone] (fax)  [facility]

## 2023-05-08 LAB — LIPID PANEL
Chol/HDL Ratio: 4.3 ratio (ref 0.0–4.4)
Cholesterol, Total: 282 mg/dL — ABNORMAL HIGH (ref 100–199)
HDL: 66 mg/dL
LDL Chol Calc (NIH): 158 mg/dL — ABNORMAL HIGH (ref 0–99)
Triglycerides: 313 mg/dL — ABNORMAL HIGH (ref 0–149)
VLDL Cholesterol Cal: 58 mg/dL — ABNORMAL HIGH (ref 5–40)

## 2023-05-08 LAB — HEPATIC FUNCTION PANEL
ALT: 37 [IU]/L — ABNORMAL HIGH (ref 0–32)
AST: 24 [IU]/L (ref 0–40)
Alkaline Phosphatase: 71 [IU]/L (ref 44–121)
Bilirubin Total: 0.5 mg/dL (ref 0.0–1.2)
Bilirubin, Direct: 0.12 mg/dL (ref 0.00–0.40)
Total Protein: 7.8 g/dL (ref 6.0–8.5)

## 2023-05-08 LAB — RENAL FUNCTION PANEL
Albumin: 4.8 g/dL (ref 3.8–4.9)
BUN/Creatinine Ratio: 23 (ref 9–23)
BUN: 19 mg/dL (ref 6–24)
CO2: 23 mmol/L (ref 20–29)
Calcium: 10.3 mg/dL — ABNORMAL HIGH (ref 8.7–10.2)
Chloride: 98 mmol/L (ref 96–106)
Creatinine, Ser: 0.84 mg/dL (ref 0.57–1.00)
Glucose: 117 mg/dL — ABNORMAL HIGH (ref 70–99)
Phosphorus: 3.8 mg/dL (ref 3.0–4.3)
Potassium: 4.8 mmol/L (ref 3.5–5.2)
Sodium: 138 mmol/L (ref 134–144)
eGFR: 82 mL/min/{1.73_m2} (ref >59)

## 2023-05-08 LAB — PT AND PTT
INR: 1 (ref 0.9–1.2)
Prothrombin Time: 10.9 s (ref 9.1–12.0)
aPTT: 27 s (ref 24–33)

## 2023-05-08 LAB — CBC
Hematocrit: 46 % (ref 34.0–46.6)
Hemoglobin: 15.3 g/dL (ref 11.1–15.9)
MCH: 30.5 pg (ref 26.6–33.0)
MCHC: 33.3 g/dL (ref 31.5–35.7)
MCV: 92 fL (ref 79–97)
Platelets: 333 10*3/uL (ref 150–450)
RBC: 5.02 x10E6/uL (ref 3.77–5.28)
RDW: 11.9 % (ref 11.7–15.4)
WBC: 9.2 10*3/uL (ref 3.4–10.8)

## 2023-05-08 LAB — T4, FREE: Free T4: 1 ng/dL (ref 0.82–1.77)

## 2023-05-08 LAB — "ABO AND RH ": Rh Factor: POSITIVE

## 2023-05-08 LAB — TSH: TSH: 1.39 u[IU]/mL (ref 0.450–4.500)

## 2023-05-10 ENCOUNTER — Other Ambulatory Visit: Payer: Self-pay

## 2023-05-10 NOTE — Progress Notes (Signed)
Labs already ordered

## 2023-05-10 NOTE — Progress Notes (Signed)
Called [facility] and have added Hem A1C

## 2023-05-12 LAB — SPECIMEN STATUS REPORT

## 2023-05-12 LAB — HGB A1C W/O EAG: Hgb A1c MFr Bld: 6.2 % — ABNORMAL HIGH (ref 4.8–5.6)

## 2023-05-20 ENCOUNTER — Telehealth: Payer: Self-pay

## 2023-05-20 ENCOUNTER — Other Ambulatory Visit: Payer: Self-pay | Admitting: Family Medicine

## 2023-05-20 DIAGNOSIS — E781 Pure hyperglyceridemia: Secondary | ICD-10-CM | POA: Diagnosis not present

## 2023-05-20 NOTE — Telephone Encounter (Signed)
Copied from CRM [id number]. Topic: General - Other >> May 20, 2023 10:45 AM Epimenio Foot F wrote: Reason for CRM: Pt is calling in because she is on her way to the office to get her labs done and she wanted to let the front desk know so they could print out her lab sheet.

## 2023-05-21 LAB — COMPREHENSIVE METABOLIC PANEL
ALT: 37 [IU]/L — ABNORMAL HIGH (ref 0–32)
AST: 28 [IU]/L (ref 0–40)
Albumin: 4.5 g/dL (ref 3.8–4.9)
Alkaline Phosphatase: 63 [IU]/L (ref 44–121)
BUN/Creatinine Ratio: 11 (ref 9–23)
BUN: 9 mg/dL (ref 6–24)
Bilirubin Total: 0.5 mg/dL (ref 0.0–1.2)
CO2: 21 mmol/L (ref 20–29)
Calcium: 10 mg/dL (ref 8.7–10.2)
Chloride: 102 mmol/L (ref 96–106)
Creatinine, Ser: 0.82 mg/dL (ref 0.57–1.00)
Globulin, Total: 2.7 g/dL (ref 1.5–4.5)
Glucose: 102 mg/dL — ABNORMAL HIGH (ref 70–99)
Potassium: 4.4 mmol/L (ref 3.5–5.2)
Sodium: 138 mmol/L (ref 134–144)
Total Protein: 7.2 g/dL (ref 6.0–8.5)
eGFR: 84 mL/min/{1.73_m2} (ref >59)

## 2023-05-21 LAB — LIPID PANEL
Chol/HDL Ratio: 3.6 ratio (ref 0.0–4.4)
Cholesterol, Total: 175 mg/dL (ref 100–199)
HDL: 49 mg/dL (ref >39)
LDL Chol Calc (NIH): 95 mg/dL (ref 0–99)
Triglycerides: 183 mg/dL — ABNORMAL HIGH (ref 0–149)
VLDL Cholesterol Cal: 31 mg/dL (ref 5–40)

## 2023-05-31 DIAGNOSIS — L720 Epidermal cyst: Secondary | ICD-10-CM | POA: Diagnosis not present

## 2023-06-16 MED ORDER — CIPROFLOXACIN HCL 500 MG PO TABS: 500.0000 mg | ORAL_TABLET | Freq: Two times a day (BID) | ORAL | 0 refills | Status: AC

## 2023-06-16 NOTE — Addendum Note (Signed)
Addended by: Mila Merry E on: 06/16/2023 12:17 PM   Modules accepted: Orders

## 2023-08-05 DIAGNOSIS — L0211 Cutaneous abscess of neck: Secondary | ICD-10-CM | POA: Diagnosis not present

## 2023-08-10 ENCOUNTER — Other Ambulatory Visit: Payer: Self-pay | Admitting: Family Medicine

## 2023-08-10 DIAGNOSIS — E2609 Other primary hyperaldosteronism: Secondary | ICD-10-CM

## 2023-08-10 DIAGNOSIS — I10 Essential (primary) hypertension: Secondary | ICD-10-CM

## 2023-08-10 MED ORDER — SPIRONOLACTONE 100 MG PO TABS
100.0000 mg | ORAL_TABLET | Freq: Every day | ORAL | 0 refills | Status: DC
Start: 2023-08-10 — End: 2023-11-05

## 2023-08-19 DIAGNOSIS — L0211 Cutaneous abscess of neck: Secondary | ICD-10-CM | POA: Diagnosis not present

## 2023-08-19 DIAGNOSIS — D234 Other benign neoplasm of skin of scalp and neck: Secondary | ICD-10-CM | POA: Diagnosis not present

## 2023-09-14 ENCOUNTER — Encounter: Payer: Self-pay | Admitting: Family Medicine

## 2023-09-14 ENCOUNTER — Telehealth: Payer: Self-pay

## 2023-09-14 DIAGNOSIS — I10 Essential (primary) hypertension: Secondary | ICD-10-CM

## 2023-09-14 MED ORDER — AMLODIPINE BESYLATE 10 MG PO TABS: 10.0000 mg | ORAL_TABLET | Freq: Every day | ORAL | 2 refills | Status: AC

## 2023-09-14 NOTE — Telephone Encounter (Signed)
 Requested medication has been sent to the pharmacy on file

## 2023-09-14 NOTE — Telephone Encounter (Signed)
 LOV 6*12*24 NOV none on file LRF [id number] LABS 11*8*24

## 2023-10-21 DIAGNOSIS — D225 Melanocytic nevi of trunk: Secondary | ICD-10-CM | POA: Diagnosis not present

## 2023-10-21 DIAGNOSIS — L821 Other seborrheic keratosis: Secondary | ICD-10-CM | POA: Diagnosis not present

## 2023-10-21 DIAGNOSIS — D2271 Melanocytic nevi of right lower limb, including hip: Secondary | ICD-10-CM | POA: Diagnosis not present

## 2023-10-21 DIAGNOSIS — Z85828 Personal history of other malignant neoplasm of skin: Secondary | ICD-10-CM | POA: Diagnosis not present

## 2023-10-21 DIAGNOSIS — D2262 Melanocytic nevi of left upper limb, including shoulder: Secondary | ICD-10-CM | POA: Diagnosis not present

## 2023-10-21 DIAGNOSIS — D2272 Melanocytic nevi of left lower limb, including hip: Secondary | ICD-10-CM | POA: Diagnosis not present

## 2023-10-21 DIAGNOSIS — D2261 Melanocytic nevi of right upper limb, including shoulder: Secondary | ICD-10-CM | POA: Diagnosis not present

## 2023-10-21 DIAGNOSIS — L570 Actinic keratosis: Secondary | ICD-10-CM | POA: Diagnosis not present

## 2023-10-21 DIAGNOSIS — Z08 Encounter for follow-up examination after completed treatment for malignant neoplasm: Secondary | ICD-10-CM | POA: Diagnosis not present

## 2023-10-25 ENCOUNTER — Encounter: Payer: Self-pay | Admitting: Family Medicine

## 2023-10-25 ENCOUNTER — Ambulatory Visit: Admitting: Family Medicine

## 2023-10-25 VITALS — BP 122/80 | HR 91 | Resp 16 | Wt 150.9 lb

## 2023-10-25 DIAGNOSIS — F419 Anxiety disorder, unspecified: Secondary | ICD-10-CM

## 2023-10-25 DIAGNOSIS — I10 Essential (primary) hypertension: Secondary | ICD-10-CM

## 2023-10-25 DIAGNOSIS — F32A Depression, unspecified: Secondary | ICD-10-CM

## 2023-10-25 DIAGNOSIS — F900 Attention-deficit hyperactivity disorder, predominantly inattentive type: Secondary | ICD-10-CM

## 2023-10-25 MED ORDER — BUPROPION HCL ER (SR) 100 MG PO TB12: ORAL_TABLET | ORAL | 0 refills | Status: DC

## 2023-10-25 NOTE — Patient Instructions (Signed)
 Marland Kitchen  Please review the attached list of medications and notify my office if there are any errors.   . Please bring all of your medications to every appointment so we can make sure that our medication list is the same as yours.

## 2023-11-05 ENCOUNTER — Other Ambulatory Visit: Payer: Self-pay | Admitting: Family Medicine

## 2023-11-05 DIAGNOSIS — E2609 Other primary hyperaldosteronism: Secondary | ICD-10-CM

## 2023-11-05 DIAGNOSIS — I10 Essential (primary) hypertension: Secondary | ICD-10-CM

## 2023-11-15 NOTE — Progress Notes (Signed)
 Established patient visit   Patient: Olivia Meyer   DOB: 02/10/1966   58 y.o. Female  MRN: [id number] Visit Date: 10/25/2023  Today's healthcare provider: Jeralene Mom, MD   Chief Complaint  Patient presents with   ADHD   Subjective    Discussed the use of AI scribe software for clinical note transcription with the patient, who gave verbal consent to proceed.  History of Present Illness   Olivia Meyer is a 58 year old female who presents with concerns about ADHD and anxiety.  She has long suspected having ADHD, a belief reinforced by recent home testing where she scored 1866, above the threshold of 1399. Symptoms include difficulty sitting still, feeling overstimulated, and a 'never-ending mind.' She often feels like she is running out of time and experiences guilt when relaxing. 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  She has a history of anxiety, previously managed with Xanax , which she discontinued due to concerns about its suitability for older adults. Buspirone  was also tried but stopped four months ago due to ineffectiveness. Anxiety is often triggered by overstimulation and time pressure, accompanied by ringing in her ears and clumsiness. She has a history of depression, particularly during a difficult period in 2006-2007, but has not had suicidal thoughts since then.  Hypertension is well-controlled with spironolactone  for primary aldosteronism and an increased dose of another unspecified medication from 5 mg to 10 mg. She has quit drinking alcohol, which she believes has contributed to improved blood pressure control. She maintains a routine of going to bed early and waking up at 4 AM for work.  Her family history includes a mother with ADHD and OCD, who slept only three hours a night and was a perfectionist. She identifies with some of these traits, such as being easily distracted and starting multiple  tasks without completing them. Her youngest sister, who is ten years younger, successfully uses Wellbutrin  for ADHD.       Medications: Outpatient Medications Prior to Visit  Medication Sig   amLODipine  (NORVASC ) 10 MG tablet Take 1 tablet (10 mg total) by mouth daily.   [DISCONTINUED] spironolactone  (ALDACTONE ) 100 MG tablet Take 1 tablet (100 mg total) by mouth daily.   busPIRone  (BUSPAR ) 10 MG tablet 1/2-1 tablet twice a day   No facility-administered medications prior to visit.      Objective    BP 122/80 (BP Location: Left Arm, Patient Position: Sitting, Cuff Size: Large)   Pulse 91   Resp 16   Wt 150 lb 14.4 oz (68.4 kg)   SpO2 99%   BMI 26.73 kg/m   Physical Exam   General appearance: Well developed, well nourished female, cooperative and in no acute distress Head: Normocephalic, without obvious abnormality, atraumatic Respiratory: Respirations even and unlabored, normal respiratory rate Extremities: All extremities are intact.  Skin: Skin color, texture, turgor normal. No rashes seen  Psych: Appropriate mood and affect. Neurologic: Mental status: Alert, oriented to person, place, and time, thought content appropriate.   ASRS completed: c/with inattentive ADD.     Assessment & Plan        Attention-deficit hyperactivity disorder (ADHD) Suspected ADHD with inattention, hyperactivity, and impulsivity. Considered Wellbutrin  for off-label use in ADHD, with potential benefits for focus and anxiety reduction if secondary to ADHD. Discussed alternative treatment options such as Strattera and differences in neurotransmitter effects. - Initiate Wellbutrin  at low dose, one tablet  daily in the morning for one week, then increase to twice daily. - Schedule follow-up in 3-4 weeks to assess treatment response and adjust as necessary.  Depression Depression with intermittent bouts, potentially related to ADHD. Considered Wellbutrin  for its potential benefits for depressive  symptoms. Discussed that Wellbutrin  is not primarily an anxiety treatment but may help if anxiety is secondary to ADHD. - Monitor for side effects and efficacy, adjust dose based on tolerance and response.  Anxiety disorder Chronic anxiety possibly secondary to ADHD. Previous treatments included Xanax  and buspirone , discontinued due to lack of efficacy or concerns about long-term use. Discussed that Wellbutrin  is not primarily an anxiety treatment and may worsen anxiety in some cases. - Evaluate response to Wellbutrin , consider alternative treatments if anxiety persists or worsens.  Hypertension Hypertension well-controlled with current medication regimen. Blood pressure stable since increasing medication dose and lifestyle changes, including cessation of alcohol. - Continue current antihypertensive medication at 10 mg daily.    Return in about 3 weeks (around 11/15/2023).       Jeralene Mom, MD  [facility] [phone] (phone) [phone] (fax)  [facility]

## 2023-11-16 ENCOUNTER — Ambulatory Visit: Admitting: Family Medicine

## 2023-11-18 ENCOUNTER — Other Ambulatory Visit: Payer: Self-pay | Admitting: Family Medicine

## 2023-12-17 ENCOUNTER — Ambulatory Visit: Admitting: Family Medicine

## 2024-01-05 ENCOUNTER — Other Ambulatory Visit: Payer: Self-pay

## 2024-01-05 ENCOUNTER — Encounter: Payer: Self-pay | Admitting: Family Medicine

## 2024-01-05 MED ORDER — BUPROPION HCL ER (SR) 100 MG PO TB12
ORAL_TABLET | ORAL | 0 refills | Status: DC
Start: 2024-01-05 — End: 2024-01-19

## 2024-01-19 ENCOUNTER — Ambulatory Visit (INDEPENDENT_AMBULATORY_CARE_PROVIDER_SITE_OTHER): Admitting: Family Medicine

## 2024-01-19 VITALS — BP 123/84 | HR 71 | Resp 16 | Wt 147.6 lb

## 2024-01-19 DIAGNOSIS — F419 Anxiety disorder, unspecified: Secondary | ICD-10-CM

## 2024-01-19 DIAGNOSIS — I10 Essential (primary) hypertension: Secondary | ICD-10-CM

## 2024-01-19 DIAGNOSIS — F32A Depression, unspecified: Secondary | ICD-10-CM | POA: Diagnosis not present

## 2024-01-19 DIAGNOSIS — L03012 Cellulitis of left finger: Secondary | ICD-10-CM | POA: Diagnosis not present

## 2024-01-19 DIAGNOSIS — F900 Attention-deficit hyperactivity disorder, predominantly inattentive type: Secondary | ICD-10-CM | POA: Diagnosis not present

## 2024-01-19 MED ORDER — CEPHALEXIN 500 MG PO CAPS: 500.0000 mg | ORAL_CAPSULE | Freq: Three times a day (TID) | ORAL | 0 refills | Status: AC

## 2024-01-19 MED ORDER — BUPROPION HCL ER (SR) 150 MG PO TB12
150.0000 mg | ORAL_TABLET | Freq: Two times a day (BID) | ORAL | 1 refills | Status: DC
Start: 2024-01-19 — End: 2024-03-01

## 2024-01-19 NOTE — Patient Instructions (Signed)
 Marland Kitchen  Please review the attached list of medications and notify my office if there are any errors.   . Please bring all of your medications to every appointment so we can make sure that our medication list is the same as yours.

## 2024-01-19 NOTE — Progress Notes (Signed)
 Established patient visit   Patient: Olivia Meyer   DOB: [birth year]   58 y.o. Female  MRN: [id number] Visit Date: 01/19/2024  Today's healthcare provider: Nancyann Perry, MD   Chief Complaint  Patient presents with   ADHD    Follow-Up   Subjective    Discussed the use of AI scribe software for clinical note transcription with the patient, who gave verbal consent to proceed.  History of Present Illness   Olivia Meyer is a 58 year old female with ADHD and anxiety who presents for medication management and chronic paronychia.  Her energy levels initially improved after starting bupropion  last month for ADHD and anxiety, but the effects have now plateaued. She experiences frustration and difficulty controlling her emotions, though there is no significant change in her sleep patterns. She is currently taking bupropion  100 mg twice a day and has not experienced any jitters or increased anxiety.  She has a chronic paronychia on her right middle finger, specifically affecting the same finger repeatedly. The condition has persisted for about three months, causing significant pain and swelling, especially during flare-ups. The pain is severe enough to feel her heartbeat in the affected area, and it sometimes wakes her up at night. She has been managing it with antibacterial cream and keeping it covered, but the issue persists. 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 She recalls smashing the finger, which may have initiated the problem. She has previously taken cephalexin  without any allergic reactions.      Medications: Outpatient Medications Prior to Visit  Medication Sig   amLODipine  (NORVASC ) 10 MG tablet Take 1 tablet (10 mg total) by mouth daily.   spironolactone  (ALDACTONE ) 100 MG tablet Take 1 tablet by mouth once daily   buPROPion  ER (WELLBUTRIN  SR) 100 MG 12 hr tablet TAKE 1 TABLET DAILY FOR 7 DAYS, THEN INCREASE TO 1  TABLET TWICE A DAY   No facility-administered medications prior to visit.   Review of Systems  Constitutional:  Negative for appetite change, chills, fatigue and fever.  Respiratory:  Negative for chest tightness and shortness of breath.   Cardiovascular:  Negative for chest pain and palpitations.  Gastrointestinal:  Negative for abdominal pain, nausea and vomiting.  Neurological:  Negative for dizziness and weakness.       Objective    BP 123/84 (BP Location: Left Arm, Patient Position: Sitting, Cuff Size: Normal)   Pulse 71   Resp 16   Wt 147 lb 9.6 oz (67 kg)   SpO2 100%   BMI 26.15 kg/m   Physical Exam   General appearance: Well developed, well nourished female, cooperative and in no acute distress Head: Normocephalic, without obvious abnormality, atraumatic Respiratory: Respirations even and unlabored, normal respiratory rate Extremities: All extremities are intact.  Skin: Skin color, texture, turgor normal. No rashes seen  Psych: Appropriate mood and affect. Neurologic: Mental status: Alert, oriented to person, place, and time, thought content appropriate.   Assessment & Plan    1. Attention deficit hyperactivity disorder (ADHD), predominantly inattentive type (Primary) A bit of improvement when bupropion  initiated, but now about back to baseline. She is interested in titrating the dose up.   - buPROPion  ER (WELLBUTRIN  SR) 150 MG 12 hr tablet; Take 1 tablet (150 mg total) by mouth 2 (two) times daily.  Dispense: 60 tablet; Refill: 1  2. Anxiety She feels this is related to underlying depression and ADHD symptoms.   3.  Depressive disorder Improved with bupropion  and increasing dose today.   4. Essential (primary) hypertension Improved since last visit when bupropion  was started.   5. Paronychia of finger, left  - cephALEXin  (KEFLEX ) 500 MG capsule; Take 1 capsule (500 mg total) by mouth 3 (three) times daily for 10 days.  Dispense: 30 capsule; Refill:  0   Return in about 6 weeks (around 03/01/2024).     Nancyann Perry, MD  [facility] [phone] (phone) [phone] (fax)  [facility]

## 2024-02-07 ENCOUNTER — Encounter: Payer: Self-pay | Admitting: Family Medicine

## 2024-02-08 MED ORDER — FLUCONAZOLE 150 MG PO TABS: 150.0000 mg | ORAL_TABLET | Freq: Once | ORAL | 0 refills | Status: AC

## 2024-02-12 ENCOUNTER — Other Ambulatory Visit: Payer: Self-pay | Admitting: Family Medicine

## 2024-02-12 DIAGNOSIS — I10 Essential (primary) hypertension: Secondary | ICD-10-CM

## 2024-02-12 DIAGNOSIS — E2609 Other primary hyperaldosteronism: Secondary | ICD-10-CM

## 2024-02-23 ENCOUNTER — Encounter: Payer: Self-pay | Admitting: Family Medicine

## 2024-03-01 ENCOUNTER — Ambulatory Visit (INDEPENDENT_AMBULATORY_CARE_PROVIDER_SITE_OTHER): Admitting: Family Medicine

## 2024-03-01 ENCOUNTER — Encounter: Payer: Self-pay | Admitting: Family Medicine

## 2024-03-01 DIAGNOSIS — F32A Depression, unspecified: Secondary | ICD-10-CM

## 2024-03-01 DIAGNOSIS — F900 Attention-deficit hyperactivity disorder, predominantly inattentive type: Secondary | ICD-10-CM | POA: Diagnosis not present

## 2024-03-01 DIAGNOSIS — F419 Anxiety disorder, unspecified: Secondary | ICD-10-CM

## 2024-03-01 MED ORDER — BUPROPION HCL ER (SR) 150 MG PO TB12: 150.0000 mg | ORAL_TABLET | Freq: Two times a day (BID) | ORAL | 3 refills | Status: AC

## 2024-03-01 NOTE — Patient Instructions (Signed)
 SABRA  Please review the attached list of medications and notify my office if there are any errors.   . Please bring all of your medications to every appointment so we can make sure that our medication list is the same as yours.

## 2024-03-01 NOTE — Progress Notes (Signed)
 Established patient visit   Patient: Olivia Meyer   DOB: Apr 30, 1966   58 y.o. Female  MRN: [id number] Visit Date: 03/01/2024  Today's healthcare provider: Nancyann Perry, MD   Chief Complaint  Patient presents with   Medical Management of Chronic Issues    Patient reports taking medications as prescribed and reports she feels the increase in wellbutrin  helped as she can tell the difference this time around.   Subjective    Discussed the use of AI scribe software for clinical note transcription with the patient, who gave verbal consent to proceed.  History of Present Illness   Olivia Meyer is a 58 year old female who presents for follow-up on attention deficit disorder (ADD).  Six weeks ago, her bupropion  dosage was increased from 100 mg twice daily to 150 mg twice daily, resulting in significant improvement in her symptoms. She reports better focus and an ability to handle stressful situations more calmly. During a recent trip to [place], she managed travel delays without panic, attributing this to the medication adjustment.  She experiences no side effects from the increased bupropion  dosage and feels that the current dose is effective. She is currently taking bupropion  150 mg twice daily and has about two weeks of medication remaining.  Regarding her sleep, she sometimes wakes up at 3 or 4 AM and is unable to return to sleep, which she attributes to her sleep pattern and possibly her hormonal stage in life, as she believes she is through menopause. She typically wakes up at 4 AM daily, even on weekends, and sometimes goes to bed too early.       Medications: Outpatient Medications Prior to Visit  Medication Sig   amLODipine  (NORVASC ) 10 MG tablet Take 1 tablet (10 mg total) by mouth daily.   spironolactone  (ALDACTONE ) 100 MG tablet Take 1 tablet by mouth once daily   buPROPion  ER (WELLBUTRIN  SR) 150 MG 12 hr tablet Take 1 tablet (150 mg total) by mouth 2 (two) times  daily.   No facility-administered medications prior to visit.   Review of Systems  Constitutional:  Negative for appetite change, chills, fatigue and fever.  Respiratory:  Negative for chest tightness and shortness of breath.   Cardiovascular:  Negative for chest pain and palpitations.  Gastrointestinal:  Negative for abdominal pain, nausea and vomiting.  Neurological:  Negative for dizziness and weakness.       Objective    BP 118/89 (BP Location: Left Arm, Patient Position: Sitting, Cuff Size: Normal)   Pulse 71   Ht 5' 3 (1.6 m)   Wt 147 lb 3.2 oz (66.8 kg)   SpO2 100%   BMI 26.08 kg/m   Physical Exam   General appearance: Well developed, well nourished female, cooperative and in no acute distress Head: Normocephalic, without obvious abnormality, atraumatic Respiratory: Respirations even and unlabored, normal respiratory rate Extremities: All extremities are intact.  Skin: Skin color, texture, turgor normal. No rashes seen  Psych: Appropriate mood and affect. Neurologic: Mental status: Alert, oriented to person, place, and time, thought content appropriate.    Assessment & Plan    1. Attention deficit hyperactivity disorder (ADHD), predominantly inattentive type Much better with increased dose of  buPROPion  (WELLBUTRIN  SR) 150 MG 12 hr tablet; Take 1 tablet (150 mg total) by mouth 2 (two) times daily.  Dispense: 180 tablet; Refill: 3  2. Anxiety  3. Depressive disorder  She feels overall mood including anxiety and depression are significantly  better with bupropion       Nancyann Perry, MD  [facility] [phone] (phone) [phone] (fax)  [facility]

## 2024-03-02 ENCOUNTER — Encounter: Payer: Self-pay | Admitting: Family Medicine

## 2024-03-17 ENCOUNTER — Encounter: Payer: Self-pay | Admitting: *Deleted
